# Patient Record
Sex: Female | Born: 2000 | Race: Black or African American | Hispanic: No | Marital: Single | Smoking: Current some day smoker
Health system: Southern US, Community
[De-identification: ages and names within clinical notes are randomized; demographics above are authoritative.]

## PROBLEM LIST (undated history)

## (undated) DIAGNOSIS — E119 Type 2 diabetes mellitus without complications: Secondary | ICD-10-CM

## (undated) HISTORY — DX: Type 2 diabetes mellitus without complications: E11.9

---

## 2020-11-19 ENCOUNTER — Emergency Department (HOSPITAL_COMMUNITY)
Admission: EM | Admit: 2020-11-19 | Discharge: 2020-11-20 | Disposition: A | Discharge status: Home / Self Care | Payer: Medicaid Other | Attending: Emergency Medicine | Admitting: Emergency Medicine

## 2020-11-19 ENCOUNTER — Other Ambulatory Visit: Payer: Self-pay

## 2020-11-19 ENCOUNTER — Encounter (HOSPITAL_COMMUNITY): Payer: Self-pay | Admitting: Emergency Medicine

## 2020-11-19 DIAGNOSIS — F419 Anxiety disorder, unspecified: Secondary | ICD-10-CM | POA: Diagnosis not present

## 2020-11-19 DIAGNOSIS — R002 Palpitations: Secondary | ICD-10-CM | POA: Diagnosis not present

## 2020-11-19 DIAGNOSIS — R079 Chest pain, unspecified: Secondary | ICD-10-CM

## 2020-11-19 DIAGNOSIS — R0602 Shortness of breath: Secondary | ICD-10-CM | POA: Diagnosis not present

## 2020-11-19 DIAGNOSIS — R0789 Other chest pain: Secondary | ICD-10-CM | POA: Diagnosis not present

## 2020-11-19 DIAGNOSIS — N9489 Other specified conditions associated with female genital organs and menstrual cycle: Secondary | ICD-10-CM | POA: Diagnosis not present

## 2020-11-19 NOTE — ED Triage Notes (Signed)
Complains of chest tightness, shortness of breath, palpitations and anxiety x1 week, has had recent stress in her life, also states she thinks when she wakes up she is having panic attacks.

## 2020-11-19 NOTE — ED Provider Notes (Signed)
Emergency Medicine Provider Triage Evaluation Note  Shannon Santos , a 20 y.o. female  was evaluated in triage.  Pt complains of episodes of left-sided arm discomfort, chest tightness, shortness of breath, and palpitations for 1 week.  Patient notes increased anxiety in the past week as she recently lost her brother and has had other stressors.  She states that she is having what she believes to be panic attacks multiple times per day.  In triage she is not having any symptoms.  Review of Systems  Positive: Anxiety, panic attacks, chest tightness, shortness of breath Negative: Fevers, chills, SI, HI, audio or visual hallucinations  Physical Exam  BP (!) 146/80   Pulse 83   Temp 98.4 F (36.9 C) (Oral)   Resp 18   LMP 11/10/2020   SpO2 98%  Gen:   Awake, no distress   Resp:  Normal effort  MSK:   Moves extremities without difficulty  Other:  Lung sounds clear to auscultation all fields  Medical Decision Making  Medically screening exam initiated at 7:12 PM.  Appropriate orders placed.  Genae Strine was informed that the remainder of the evaluation will be completed by another provider, this initial triage assessment does not replace that evaluation, and the importance of remaining in the ED until their evaluation is complete.    Jeanella Flattery 11/19/20 1914    Dione Booze, MD 11/20/20 [phone]

## 2020-11-20 ENCOUNTER — Emergency Department (HOSPITAL_COMMUNITY): Payer: Medicaid Other

## 2020-11-20 LAB — CBC WITH DIFFERENTIAL/PLATELET
Abs Immature Granulocytes: 0.03 10*3/uL (ref 0.00–0.07)
Basophils Absolute: 0 10*3/uL (ref 0.0–0.1)
Basophils Relative: 0 %
Eosinophils Absolute: 0.1 10*3/uL (ref 0.0–0.5)
Eosinophils Relative: 1 %
HCT: 42.8 % (ref 36.0–46.0)
Hemoglobin: 14 g/dL (ref 12.0–15.0)
Immature Granulocytes: 0 %
Lymphocytes Relative: 39 %
Lymphs Abs: 2.6 10*3/uL (ref 0.7–4.0)
MCH: 28.9 pg (ref 26.0–34.0)
MCHC: 32.7 g/dL (ref 30.0–36.0)
MCV: 88.4 fL (ref 80.0–100.0)
Monocytes Absolute: 0.5 10*3/uL (ref 0.1–1.0)
Monocytes Relative: 7 %
Neutro Abs: 3.5 10*3/uL (ref 1.7–7.7)
Neutrophils Relative %: 53 %
Platelets: 296 10*3/uL (ref 150–400)
RBC: 4.84 MIL/uL (ref 3.87–5.11)
RDW: 13.2 % (ref 11.5–15.5)
WBC: 6.7 10*3/uL (ref 4.0–10.5)
nRBC: 0 % (ref 0.0–0.2)

## 2020-11-20 LAB — I-STAT BETA HCG BLOOD, ED (MC, WL, AP ONLY): I-stat hCG, quantitative: <5 m[IU]/mL (ref <5)

## 2020-11-20 LAB — BASIC METABOLIC PANEL
Anion gap: 8 (ref 5–15)
BUN: 12 mg/dL (ref 6–20)
CO2: 26 mmol/L (ref 22–32)
Calcium: 9.5 mg/dL (ref 8.9–10.3)
Chloride: 102 mmol/L (ref 98–111)
Creatinine, Ser: 0.65 mg/dL (ref 0.44–1.00)
GFR, Estimated: >60 mL/min (ref >60)
Glucose, Bld: 97 mg/dL (ref 70–99)
Potassium: 3.7 mmol/L (ref 3.5–5.1)
Sodium: 136 mmol/L (ref 135–145)

## 2020-11-20 LAB — TROPONIN I (HIGH SENSITIVITY): Troponin I (High Sensitivity): 2 ng/L (ref <18)

## 2020-11-20 NOTE — Discharge Instructions (Signed)
Your evaluation today did not show any sign of any serious heart problem.  However, I would still like you to follow-up with a cardiologist to set up a heart monitor to make sure you are not having any problem with your heart rhythm.  You have just gone through a very traumatic time.  I think you would benefit from talking with a counselor.  Please use the resource guide to help you find a counselor to work through the problems that you are having.

## 2020-11-20 NOTE — ED Provider Notes (Signed)
[facility]-EMERGENCY DEPT Provider Note   CSN: [id number] Arrival date & time: 11/19/20  1827     History Chief Complaint  Patient presents with   Anxiety   Shortness of Breath   Chest Pain    Shannon Santos is a 20 y.o. female.  The history is provided by the patient.  Anxiety Associated symptoms include chest pain and shortness of breath.  Shortness of Breath Associated symptoms: chest pain   Chest Pain Associated symptoms: anxiety and shortness of breath   She has no significant medical history and comes in complaining of chest pain, shortness of breath, heart racing over the last week.  2 weeks ago, her brother died by suicide.  About 1 week ago, she started having tight feeling across her chest with sense that her heart was racing.  She could feel her heartbeat racing in her arms.  She states that she checked her pulse at home and it was going "real fast".  She denies nausea, vomiting, diaphoresis.  Nothing makes her symptoms better, nothing makes them worse.  She sometimes wakes up feeling like her heart is racing.  Symptoms are present constantly, but she states that currently her heart is not racing.  She is a non-smoker and denies history of diabetes, hypertension, hyperlipidemia.  There is no family history of premature coronary atherosclerosis.   History reviewed. No pertinent past medical history.  There are no problems to display for this patient.   History reviewed. No pertinent surgical history.   OB History   No obstetric history on file.     No family history on file.     Home Medications Prior to Admission medications   Not on File    Allergies    Patient has no known allergies.  Review of Systems   Review of Systems  Respiratory:  Positive for shortness of breath.   Cardiovascular:  Positive for chest pain.  All other systems reviewed and are negative.  Physical Exam Updated Vital Signs BP (!) 146/80   Pulse 83    Temp 98.4 F (36.9 C) (Oral)   Resp 18   LMP 11/10/2020   SpO2 98%   Physical Exam Vitals and nursing note reviewed.  20 year old female, resting comfortably and in no acute distress. Vital signs are significant for borderline elevated blood pressure. Oxygen saturation is 98%, which is normal. Head is normocephalic and atraumatic. PERRLA, EOMI. Oropharynx is clear. Neck is nontender and supple without adenopathy or JVD. Back is nontender and there is no CVA tenderness. Lungs are clear without rales, wheezes, or rhonchi. Chest is nontender. Heart has regular rate and rhythm without murmur. Abdomen is soft, flat, nontender. Extremities have no cyanosis or edema, full range of motion is present. Skin is warm and dry without rash. Neurologic: Mental status is normal, cranial nerves are intact, moves all extremities equally.  ED Results / Procedures / Treatments   Labs (all labs ordered are listed, but only abnormal results are displayed) Labs Reviewed  CBC WITH DIFFERENTIAL/PLATELET  BASIC METABOLIC PANEL  I-STAT BETA HCG BLOOD, ED ([facility], [facility], [facility] ONLY)  TROPONIN I (HIGH SENSITIVITY)    EKG EKG Interpretation  Date/Time:  Sunday November 20 2020 05:44:03 EDT Ventricular Rate:  76 PR Interval:  174 QRS Duration: 85 QT Interval:  400 QTC Calculation: 450 R Axis:   40 Text Interpretation: Sinus rhythm ST elev, probable normal early repol pattern No old tracing to compare Confirmed by Dione Booze ([id number]) on  11/20/2020 5:47:48 AM  Radiology DG Chest 2 View  Result Date: 11/20/2020 CLINICAL DATA:  Chest pain and chest tightness. Shortness of breath with palpitations and anxiety. EXAM: CHEST - 2 VIEW COMPARISON:  None. FINDINGS: The heart size and mediastinal contours are within normal limits. Both lungs are clear. The visualized skeletal structures are unremarkable. IMPRESSION: No active cardiopulmonary disease. Electronically Signed   By: Signa Kell M.D.   On: 11/20/2020 06:44     Procedures Procedures   Medications Ordered in ED Medications - No data to display  ED Course  I have reviewed the triage vital signs and the nursing notes.  Pertinent labs & imaging results that were available during my care of the patient were reviewed by me and considered in my medical decision making (see chart for details).   MDM Rules/Calculators/A&P                         Chest discomfort which is very unlikely to be cardiac.  Subjective palpitations.  Temporal relation of symptoms to traumatic life event is strongly suggestive of stress reaction.  ECG is significant only for changes of early repolarization.  Patient risk score per heart pathway is 1, which puts her at very low risk for major adverse cardiac events.  Will check ECG, chest x-ray, screening labs.  Anticipate referral for outpatient counseling and also referral to cardiology for consideration of event monitor to make sure she is not having any actual arrhythmias.  Old records were reviewed, and she has no relevant past visits.  Chest x-ray is normal.  Labs are normal including normal troponin.  Patient is advised of these findings.  She is referred to cardiology for consideration for outpatient event monitoring, also given resource guide for outpatient counseling resources.  Return precautions discussed.  Final Clinical Impression(s) / ED Diagnoses Final diagnoses:  Chest pain with low risk for cardiac etiology  Palpitations  Anxiety    Rx / DC Orders ED Discharge Orders     None        Dione Booze, MD 11/20/20 [phone]

## 2020-11-28 DIAGNOSIS — F410 Panic disorder [episodic paroxysmal anxiety] without agoraphobia: Secondary | ICD-10-CM | POA: Diagnosis not present

## 2020-11-28 DIAGNOSIS — F331 Major depressive disorder, recurrent, moderate: Secondary | ICD-10-CM | POA: Diagnosis not present

## 2020-12-08 DIAGNOSIS — F331 Major depressive disorder, recurrent, moderate: Secondary | ICD-10-CM | POA: Diagnosis not present

## 2020-12-08 DIAGNOSIS — F410 Panic disorder [episodic paroxysmal anxiety] without agoraphobia: Secondary | ICD-10-CM | POA: Diagnosis not present

## 2020-12-16 DIAGNOSIS — F410 Panic disorder [episodic paroxysmal anxiety] without agoraphobia: Secondary | ICD-10-CM | POA: Diagnosis not present

## 2020-12-16 DIAGNOSIS — F331 Major depressive disorder, recurrent, moderate: Secondary | ICD-10-CM | POA: Diagnosis not present

## 2020-12-19 DIAGNOSIS — F331 Major depressive disorder, recurrent, moderate: Secondary | ICD-10-CM | POA: Diagnosis not present

## 2020-12-19 DIAGNOSIS — F410 Panic disorder [episodic paroxysmal anxiety] without agoraphobia: Secondary | ICD-10-CM | POA: Diagnosis not present

## 2020-12-27 DIAGNOSIS — F331 Major depressive disorder, recurrent, moderate: Secondary | ICD-10-CM | POA: Diagnosis not present

## 2020-12-27 DIAGNOSIS — F410 Panic disorder [episodic paroxysmal anxiety] without agoraphobia: Secondary | ICD-10-CM | POA: Diagnosis not present

## 2021-01-04 DIAGNOSIS — F410 Panic disorder [episodic paroxysmal anxiety] without agoraphobia: Secondary | ICD-10-CM | POA: Diagnosis not present

## 2021-01-04 DIAGNOSIS — F331 Major depressive disorder, recurrent, moderate: Secondary | ICD-10-CM | POA: Diagnosis not present

## 2021-01-11 DIAGNOSIS — F331 Major depressive disorder, recurrent, moderate: Secondary | ICD-10-CM | POA: Diagnosis not present

## 2021-01-11 DIAGNOSIS — F410 Panic disorder [episodic paroxysmal anxiety] without agoraphobia: Secondary | ICD-10-CM | POA: Diagnosis not present

## 2021-01-20 DIAGNOSIS — F331 Major depressive disorder, recurrent, moderate: Secondary | ICD-10-CM | POA: Diagnosis not present

## 2021-01-20 DIAGNOSIS — F410 Panic disorder [episodic paroxysmal anxiety] without agoraphobia: Secondary | ICD-10-CM | POA: Diagnosis not present

## 2021-02-09 ENCOUNTER — Ambulatory Visit (INDEPENDENT_AMBULATORY_CARE_PROVIDER_SITE_OTHER): Payer: Medicaid Other

## 2021-02-09 ENCOUNTER — Other Ambulatory Visit: Payer: Self-pay

## 2021-02-09 ENCOUNTER — Encounter: Payer: Self-pay | Admitting: Emergency Medicine

## 2021-02-09 ENCOUNTER — Ambulatory Visit
Admission: EM | Admit: 2021-02-09 | Discharge: 2021-02-09 | Disposition: A | Discharge status: Home / Self Care | Payer: Medicaid Other

## 2021-02-09 DIAGNOSIS — R0989 Other specified symptoms and signs involving the circulatory and respiratory systems: Secondary | ICD-10-CM

## 2021-02-09 DIAGNOSIS — J392 Other diseases of pharynx: Secondary | ICD-10-CM

## 2021-02-09 MED ORDER — PREDNISONE 20 MG PO TABS: 40.0000 mg | ORAL_TABLET | Freq: Every day | ORAL | 0 refills | Status: AC

## 2021-02-09 NOTE — ED Triage Notes (Signed)
Patient states that she feels like something is stuck in her throat.  Not for sure if she ate something but she can't get it to go away.  Requesting an xray.

## 2021-02-09 NOTE — ED Provider Notes (Signed)
[facility]    CSN: [id number] Arrival date & time: 02/09/21  1151      History   Chief Complaint Chief Complaint  Patient presents with   Possible Foreign Body in Throat    HPI Joselynn Amoroso is a 21 y.o. female.   Patient here today for evaluation of the sensation of feeling as if something is stuck in her throat. She reports that she started feeling this way yesterday after eating cake at work. She has not had any shortness of breath. She requests xray.   The history is provided by the patient.   History reviewed. No pertinent past medical history.  There are no problems to display for this patient.   History reviewed. No pertinent surgical history.  OB History   No obstetric history on file.      Home Medications    Prior to Admission medications   Medication Sig Start Date End Date Taking? Authorizing Provider  predniSONE (DELTASONE) 20 MG tablet Take 2 tablets (40 mg total) by mouth daily with breakfast for 5 days. 02/09/21 02/14/21 Yes Tomi Bamberger, PA-C  valACYclovir (VALTREX) 1000 MG tablet Take 1,000 mg by mouth daily. 01/30/21  Yes [provider]    Family History History reviewed. No pertinent family history.  Social History Social History   Tobacco Use   Smoking status: Never   Smokeless tobacco: Never  Substance Use Topics   Alcohol use: Never   Drug use: Never     Allergies   Patient has no known allergies.   Review of Systems Review of Systems  Constitutional:  Negative for chills and fever.  HENT:  Negative for sore throat and trouble swallowing.   Eyes:  Negative for discharge and redness.  Respiratory:  Negative for shortness of breath.   Gastrointestinal:  Negative for nausea and vomiting.    Physical Exam Triage Vital Signs ED Triage Vitals [02/09/21 1233]  Enc Vitals Group     BP 129/81     Pulse Rate 90     Resp      Temp 98.4 F (36.9 C)     Temp Source Oral     SpO2 97 %     Weight 232 lb  (105.2 kg)     Height 5\' 4"  (1.626 m)     Head Circumference      Peak Flow      Pain Score 4     Pain Loc      Pain Edu?      Excl. in GC?    No data found.  Updated Vital Signs BP 129/81 (BP Location: Right Arm)    Pulse 90    Temp 98.4 F (36.9 C) (Oral)    Ht 5\' 4"  (1.626 m)    Wt 232 lb (105.2 kg)    LMP 10/29/2020 Comment: Patient states her cycles are abnormal   SpO2 97%    BMI 39.82 kg/m     Physical Exam Vitals and nursing note reviewed.  Constitutional:      General: She is not in acute distress.    Appearance: Normal appearance. She is not ill-appearing.  HENT:     Head: Normocephalic and atraumatic.     Nose: Nose normal. No congestion or rhinorrhea.     Mouth/Throat:     Mouth: Mucous membranes are moist.     Pharynx: No oropharyngeal exudate or posterior oropharyngeal erythema.  Eyes:     Conjunctiva/sclera: Conjunctivae normal.  Cardiovascular:  Rate and Rhythm: Normal rate.  Pulmonary:     Effort: Pulmonary effort is normal. No respiratory distress.  Skin:    General: Skin is warm and dry.  Neurological:     Mental Status: She is alert.  Psychiatric:        Mood and Affect: Mood normal.        Thought Content: Thought content normal.     UC Treatments / Results  Labs (all labs ordered are listed, but only abnormal results are displayed) Labs Reviewed - No data to display  EKG   Radiology DG Neck Soft Tissue  Result Date: 02/09/2021 CLINICAL DATA:  Provided history: Possible foreign body in throat. Additional history provided: Patient feels as though something is stuck in throat. EXAM: NECK SOFT TISSUES - 1+ VIEW COMPARISON:  Chest radiographs 11/20/2020. FINDINGS: There is no evidence of retropharyngeal soft tissue swelling or epiglottic enlargement. The cervical airway is unremarkable and no radio-opaque foreign body identified. IMPRESSION: Unremarkable examination.  No radiopaque foreign body identified. Electronically Signed   By: Jackey Loge D.O.   On: 02/09/2021 13:18    Procedures Procedures (including critical care time)  Medications Ordered in UC Medications - No data to display  Initial Impression / Assessment and Plan / UC Course  I have reviewed the triage vital signs and the nursing notes.  Pertinent labs & imaging results that were available during my care of the patient were reviewed by me and considered in my medical decision making (see chart for details).    Xray normal-- will trial steroid to hopefully decreased any inflammation. Encouraged follow up with any concerns, and advised she report to ED with any worsening symptoms.  Patient expresses understanding.   Final Clinical Impressions(s) / UC Diagnoses   Final diagnoses:  Throat irritation   Discharge Instructions   None    ED Prescriptions     Medication Sig Dispense Auth. Provider   predniSONE (DELTASONE) 20 MG tablet Take 2 tablets (40 mg total) by mouth daily with breakfast for 5 days. 10 tablet Tomi Bamberger, PA-C      PDMP not reviewed this encounter.   Tomi Bamberger, PA-C 02/09/21 1342

## 2021-02-10 ENCOUNTER — Ambulatory Visit (HOSPITAL_COMMUNITY): Payer: Self-pay

## 2021-02-16 ENCOUNTER — Emergency Department (HOSPITAL_COMMUNITY)
Admission: EM | Admit: 2021-02-16 | Discharge: 2021-02-16 | Disposition: A | Discharge status: Home / Self Care | Payer: Medicaid Other | Attending: Emergency Medicine | Admitting: Emergency Medicine

## 2021-02-16 ENCOUNTER — Telehealth: Payer: Self-pay | Admitting: *Deleted

## 2021-02-16 DIAGNOSIS — K219 Gastro-esophageal reflux disease without esophagitis: Secondary | ICD-10-CM | POA: Insufficient documentation

## 2021-02-16 DIAGNOSIS — R682 Dry mouth, unspecified: Secondary | ICD-10-CM | POA: Insufficient documentation

## 2021-02-16 DIAGNOSIS — R109 Unspecified abdominal pain: Secondary | ICD-10-CM | POA: Diagnosis present

## 2021-02-16 LAB — CBC WITH DIFFERENTIAL/PLATELET
Abs Immature Granulocytes: 0.03 10*3/uL (ref 0.00–0.07)
Basophils Absolute: 0 10*3/uL (ref 0.0–0.1)
Basophils Relative: 0 %
Eosinophils Absolute: 0.1 10*3/uL (ref 0.0–0.5)
Eosinophils Relative: 2 %
HCT: 42.6 % (ref 36.0–46.0)
Hemoglobin: 13.8 g/dL (ref 12.0–15.0)
Immature Granulocytes: 0 %
Lymphocytes Relative: 29 %
Lymphs Abs: 2.1 10*3/uL (ref 0.7–4.0)
MCH: 28.5 pg (ref 26.0–34.0)
MCHC: 32.4 g/dL (ref 30.0–36.0)
MCV: 88 fL (ref 80.0–100.0)
Monocytes Absolute: 0.6 10*3/uL (ref 0.1–1.0)
Monocytes Relative: 8 %
Neutro Abs: 4.5 10*3/uL (ref 1.7–7.7)
Neutrophils Relative %: 61 %
Platelets: 356 10*3/uL (ref 150–400)
RBC: 4.84 MIL/uL (ref 3.87–5.11)
RDW: 13.2 % (ref 11.5–15.5)
WBC: 7.4 10*3/uL (ref 4.0–10.5)
nRBC: 0 % (ref 0.0–0.2)

## 2021-02-16 LAB — I-STAT BETA HCG BLOOD, ED (MC, WL, AP ONLY): I-stat hCG, quantitative: <5 m[IU]/mL (ref <5)

## 2021-02-16 LAB — COMPREHENSIVE METABOLIC PANEL
ALT: 16 U/L (ref 0–44)
AST: 13 U/L — ABNORMAL LOW (ref 15–41)
Albumin: 4 g/dL (ref 3.5–5.0)
Alkaline Phosphatase: 72 U/L (ref 38–126)
Anion gap: 7 (ref 5–15)
BUN: 12 mg/dL (ref 6–20)
CO2: 30 mmol/L (ref 22–32)
Calcium: 9.6 mg/dL (ref 8.9–10.3)
Chloride: 102 mmol/L (ref 98–111)
Creatinine, Ser: 0.83 mg/dL (ref 0.44–1.00)
GFR, Estimated: >60 mL/min (ref >60)
Glucose, Bld: 99 mg/dL (ref 70–99)
Potassium: 4 mmol/L (ref 3.5–5.1)
Sodium: 139 mmol/L (ref 135–145)
Total Bilirubin: 0.5 mg/dL (ref 0.3–1.2)
Total Protein: 7.1 g/dL (ref 6.5–8.1)

## 2021-02-16 MED ORDER — OMEPRAZOLE 20 MG PO CPDR: 20.0000 mg | DELAYED_RELEASE_CAPSULE | Freq: Every day | ORAL | 0 refills | Status: DC

## 2021-02-16 NOTE — ED Provider Triage Note (Signed)
Emergency Medicine Provider Triage Evaluation Note  Shannon Santos , a 21 y.o. female  was evaluated in triage.  Pt complains of reflux, generalized stomach pain, sore throat. Onset 1 week ago with sore throat, then developed stomach ache and reflux. Went to UC, given mouth wash and steroids.  Review of Systems  Positive: Sore throat, spots on tongue, reflux Negative: Vomiting, changes in bowel or bladder habits  Physical Exam  BP 125/79 (BP Location: Right Arm)    Pulse 91    Temp 98.7 F (37.1 C) (Oral)    Resp 14    LMP 01/24/2021    SpO2 98%  Gen:   Awake, no distress   Resp:  Normal effort  MSK:   Moves extremities without difficulty  Other:  Mouth without significant findings, reports generalized abdominal tenderness  Medical Decision Making  Medically screening exam initiated at 9:16 AM.  Appropriate orders placed.  Shannon Santos was informed that the remainder of the evaluation will be completed by another provider, this initial triage assessment does not replace that evaluation, and the importance of remaining in the ED until their evaluation is complete.     Shannon Fend, PA-C 02/16/21 [phone]

## 2021-02-16 NOTE — ED Provider Notes (Signed)
[facility] EMERGENCY DEPARTMENT Provider Note   CSN: [id number] Arrival date & time: 02/16/21  0907     History  Chief Complaint  Patient presents with   Gastroesophageal Reflux   Shannon Santos is a well-appearing 21 y.o. female presenting today with heartburn and abdominal pain has increased over the last 1-2 weeks when she had a 2-3 day course of diarrhea nausea and vomiting.  She was seen in urgent care on 02/09/2021 and was provided steroids, which she said made it worse.  Instigators included lying down, sleeping, or random bouts of eating.  This is accompanied with dry mouth mild abdominal pain.  She also complains of "white spots "in the back of her throat.  She has never tried an antacid or PPI.  She denies dysphagia, recent vomiting, recent illness, change in bowel habits, fever, sore throat, neck pain, or urinary symptoms.    The history is provided by the patient and medical records.  Gastroesophageal Reflux Associated symptoms include abdominal pain. Pertinent negatives include no chest pain and no shortness of breath.    Home Medications Prior to Admission medications   Medication Sig Start Date End Date Taking? Authorizing Provider  omeprazole (PRILOSEC) 20 MG capsule Take 1 capsule (20 mg total) by mouth daily. 02/16/21  Yes Prince Rome, PA-C  valACYclovir (VALTREX) 1000 MG tablet Take 1,000 mg by mouth daily. 01/30/21   [provider]      Allergies    Patient has no known allergies.    Review of Systems   Review of Systems  Constitutional:  Negative for chills, diaphoresis and fever.  HENT:  Negative for ear pain, rhinorrhea and sore throat.   Eyes:  Negative for pain and visual disturbance.  Respiratory:  Negative for cough and shortness of breath.   Cardiovascular:  Negative for chest pain and palpitations.  Gastrointestinal:  Positive for abdominal pain. Negative for vomiting.  Genitourinary:  Negative for dysuria.   Musculoskeletal:  Negative for arthralgias and back pain.  Skin:  Negative for color change and rash.  Neurological:  Negative for seizures and syncope.  All other systems reviewed and are negative.  Physical Exam Updated Vital Signs BP 125/79 (BP Location: Right Arm)    Pulse 91    Temp 98.7 F (37.1 C) (Oral)    Resp 14    LMP 01/24/2021    SpO2 98%  Physical Exam Vitals and nursing note reviewed.  Constitutional:      General: She is not in acute distress.    Appearance: Normal appearance. She is well-developed.  HENT:     Head: Normocephalic and atraumatic.     Mouth/Throat:     Mouth: Mucous membranes are moist.     Pharynx: Oropharynx is clear. No oropharyngeal exudate or posterior oropharyngeal erythema.   Eyes:     Conjunctiva/sclera: Conjunctivae normal.  Cardiovascular:     Rate and Rhythm: Normal rate and regular rhythm.     Heart sounds: No murmur heard. Pulmonary:     Effort: Pulmonary effort is normal. No respiratory distress.     Breath sounds: Normal breath sounds.  Abdominal:     Palpations: Abdomen is soft.     Tenderness: There is abdominal tenderness in the epigastric area.  Musculoskeletal:        General: No swelling.     Cervical back: Neck supple.  Skin:    General: Skin is warm and dry.     Capillary Refill: Capillary refill takes  less than 2 seconds.  Neurological:     Mental Status: She is alert.  Psychiatric:        Mood and Affect: Mood normal.    ED Results / Procedures / Treatments   Labs (all labs ordered are listed, but only abnormal results are displayed) Labs Reviewed  COMPREHENSIVE METABOLIC PANEL - Abnormal; Notable for the following components:      Result Value   AST 13 (*)    All other components within normal limits  CBC WITH DIFFERENTIAL/PLATELET  I-STAT BETA HCG BLOOD, ED ([facility], [facility], [facility] ONLY)    EKG None  Radiology No results found.  Procedures Procedures    Medications Ordered in ED Medications - No data to  display  ED Course/ Medical Decision Making/ A&P                           Medical Decision Making  Well-appearing 21 year old female presents to the ED for the concern of gastroesophageal reflux and abdominal pain.  DDx includes GERD, pectoral muscle strain, appendicitis, bowel perforation, and pregnancy.  Additional history obtained from urgent care records.  I ordered and personally interpreted labs.  Patient's beta-hCG is negative so I doubt pregnancy.  CBC and CMP show electrolytes are normal and patient is not anemic.  Unlikely that she has an acute bleed or perforation based on labs, presentation, and physical exam.  Considered CT of abdomen, but do not believe this is necessary at this time based on patient history, soft abdomen with minimal abdominal pain upon palpation, long-term duration of abdominal discomfort.  Patient laughing and smiling.  Appears stable and not in any acute distress.  Patient denies recent activity or motion associated symptoms.  Patient's discomfort is worse at night and while lying supine.  Symptoms, patient history, and lack of relief from steroids strongly suggestive of GERD.    After consideration of the diagnostic results of the patient's response, I feel the patient would benefit from omeprazole until she can follow-up with her primary care.  Also recommended she have her primary care evaluate the white spot noted in her physical exam.  Discussed course of treatment with patient thoroughly.  Patient in agreement and all questions answered.        Final Clinical Impression(s) / ED Diagnoses Final diagnoses:  Gastroesophageal reflux disease, unspecified whether esophagitis present    Rx / DC Orders ED Discharge Orders          Ordered    omeprazole (PRILOSEC) 20 MG capsule  Daily        02/16/21 1012              Prince Rome, Hershal Coria [id number]    Carmin Muskrat, MD 02/16/21 1624

## 2021-02-16 NOTE — Telephone Encounter (Signed)
Pt called regarding Rx not able to be filled as the prescribing EDP is not listed on Medicaid.  RNCM contacted pharmacy and gave permission to utilize Supervising EDP listed on Rx.

## 2021-02-16 NOTE — Discharge Instructions (Addendum)
Take the omeprazole once a day.  We have provided you with a 30-day supply.  Follow-up with primary care in the next week for medical management and further evaluation.  Return to the ED if you have new or worsening symptoms.

## 2021-02-16 NOTE — ED Triage Notes (Signed)
Pt. Stated Ive had stomach issues with heart burn and My mouth is getting really dry.  I also spots on my tongue.

## 2021-02-23 DIAGNOSIS — F410 Panic disorder [episodic paroxysmal anxiety] without agoraphobia: Secondary | ICD-10-CM | POA: Diagnosis not present

## 2021-02-23 DIAGNOSIS — F331 Major depressive disorder, recurrent, moderate: Secondary | ICD-10-CM | POA: Diagnosis not present

## 2021-03-03 DIAGNOSIS — F331 Major depressive disorder, recurrent, moderate: Secondary | ICD-10-CM | POA: Diagnosis not present

## 2021-03-03 DIAGNOSIS — F410 Panic disorder [episodic paroxysmal anxiety] without agoraphobia: Secondary | ICD-10-CM | POA: Diagnosis not present

## 2021-03-10 DIAGNOSIS — F410 Panic disorder [episodic paroxysmal anxiety] without agoraphobia: Secondary | ICD-10-CM | POA: Diagnosis not present

## 2021-03-10 DIAGNOSIS — F331 Major depressive disorder, recurrent, moderate: Secondary | ICD-10-CM | POA: Diagnosis not present

## 2021-09-12 DIAGNOSIS — R079 Chest pain, unspecified: Secondary | ICD-10-CM | POA: Diagnosis not present

## 2021-09-12 DIAGNOSIS — Z20822 Contact with and (suspected) exposure to covid-19: Secondary | ICD-10-CM | POA: Diagnosis not present

## 2021-09-12 DIAGNOSIS — R0602 Shortness of breath: Secondary | ICD-10-CM | POA: Diagnosis not present

## 2021-09-12 DIAGNOSIS — R0789 Other chest pain: Secondary | ICD-10-CM | POA: Diagnosis not present

## 2021-10-09 DIAGNOSIS — Z6841 Body Mass Index (BMI) 40.0 and over, adult: Secondary | ICD-10-CM | POA: Diagnosis not present

## 2021-10-09 DIAGNOSIS — J111 Influenza due to unidentified influenza virus with other respiratory manifestations: Secondary | ICD-10-CM | POA: Diagnosis not present

## 2021-10-09 DIAGNOSIS — Z20822 Contact with and (suspected) exposure to covid-19: Secondary | ICD-10-CM | POA: Diagnosis not present

## 2021-10-19 ENCOUNTER — Emergency Department
Admission: EM | Admit: 2021-10-19 | Discharge: 2021-10-19 | Disposition: A | Discharge status: Home / Self Care | Payer: Medicaid Other | Attending: Emergency Medicine | Admitting: Emergency Medicine

## 2021-10-19 DIAGNOSIS — T7840XA Allergy, unspecified, initial encounter: Secondary | ICD-10-CM | POA: Insufficient documentation

## 2021-10-19 DIAGNOSIS — R109 Unspecified abdominal pain: Secondary | ICD-10-CM | POA: Diagnosis present

## 2021-10-19 MED ORDER — FAMOTIDINE 20 MG PO TABS
20.0000 mg | ORAL_TABLET | Freq: Once | ORAL | Status: AC
  Administered 2021-10-19: 20 mg via ORAL
  Filled 2021-10-19: qty 1

## 2021-10-19 MED ORDER — PREDNISONE 20 MG PO TABS: 40.0000 mg | ORAL_TABLET | Freq: Every day | ORAL | 0 refills | Status: AC

## 2021-10-19 MED ORDER — PREDNISONE 20 MG PO TABS
60.0000 mg | ORAL_TABLET | ORAL | Status: AC
  Administered 2021-10-19: 60 mg via ORAL
  Filled 2021-10-19: qty 3

## 2021-10-19 NOTE — ED Triage Notes (Signed)
Pt presents to ED via POV with c/o of an allergic reaction. Pt states she was in [employer] when she started getting hives and itching. Pt complaining of itching and redness. Pt took benadryl at 2005. Denies CP, SOB, difficulty breathing

## 2021-10-19 NOTE — Discharge Instructions (Signed)
You have been seen in the Emergency Department (ED) today for an allergic reaction.  You have been stable throughout your stay in the Emergency Department.  Please take your medications as prescribed and follow up with your doctor as indicated.  You should also take over-the-counter cetirizine (Zyrtec) around the clock for the next three days according to the dosing instructions on the package.  Return to the Emergency Department (ED) if you experience any worsening or new symptoms that concern you.

## 2021-10-19 NOTE — ED Provider Notes (Signed)
[facility] Provider Note    Event Date/Time   First MD Initiated Contact with Patient 10/19/21 2306     (approximate)   History   Allergic Reaction   HPI  Shannon Santos is a 21 y.o. female who presents with her boyfriend for evaluation of possible allergic reaction.  They state that she had dinner together and included some Peppercorn chicken which she has not had before.  Immediately after finishing dinner they went to [employer] and while she was at [employer] she developed sudden onset of abdominal cramping and diarrhea.  She also developed a generalized itchy rash all over her body.  She started to panic little bit because of the itching and the rash including general constellation of symptoms.  She took Benadryl 50 mg and then they came to the emergency department.  She did not have any shortness of breath or difficulty swallowing.  He did not notice any swelling to her mouth or her neck or face.  The rash has resolved by the time that I saw her.  The onset of the symptoms was within the last 2 hours.  She had a similar allergic reaction several months ago but she thought maybe it was due to her detergent or some other environmental exposure.  She has not had any issues since changing her detergent.  The exposure tonight was almost immediate after eating, not delayed by 5 or 6 hours, and she ate chicken, not beef or pork.  She is awake, alert, feeling much better, happy, in no distress at this time.     Physical Exam   Triage Vital Signs: ED Triage Vitals  Enc Vitals Group     BP 10/19/21 2225 116/74     Pulse Rate 10/19/21 2225 (!) 108     Resp 10/19/21 2225 18     Temp 10/19/21 2225 98.1 F (36.7 C)     Temp Source 10/19/21 2225 Oral     SpO2 10/19/21 2225 94 %     Weight --      Height 10/19/21 2231 1.626 m (5\' 4" )     Head Circumference --      Peak Flow --      Pain Score 10/19/21 2231 0     Pain Loc --      Pain Edu? --      Excl. in [place]?  --     Most recent vital signs: Vitals:   10/19/21 2225  BP: 116/74  Pulse: (!) 108  Resp: 18  Temp: 98.1 F (36.7 C)  SpO2: 94%     General: Awake, no distress.  CV:  Good peripheral perfusion.  Normal heart sounds.  No tachycardia during my assessment. Resp:  Normal effort.  Lungs are clear to auscultation.  No wheezing. Abd:  No distention.  No tenderness to palpation. Other:  Urticarial rash has resolved.  No angioedema.  Tolerating secretions, lying supine without difficulty.   ED Results / Procedures / Treatments   Labs (all labs ordered are listed, but only abnormal results are displayed) Labs Reviewed - No data to display      PROCEDURES:  Critical Care performed: No  Procedures   MEDICATIONS ORDERED IN ED: Medications  predniSONE (DELTASONE) tablet 60 mg (has no administration in time range)  famotidine (PEPCID) tablet 20 mg (has no administration in time range)     IMPRESSION / MDM / ASSESSMENT AND PLAN / ED COURSE  I reviewed the triage vital  signs and the nursing notes.                              Differential diagnosis includes, but is not limited to, allergic reaction, anaphylaxis, panic attack.   Patient's presentation is most consistent with acute presentation with potential threat to life or bodily function.  Fortunately the patient's symptoms have nearly completely or even completely resolved after taking Benadryl prior to arrival.  She no longer has a rash and is no longer having any abdominal pain, nausea, diarrhea, etc.  No angioedema.  No respiratory involvement.  Although arguably her initial presentation could have represented anaphylaxis, it does not at this time.  She has had no treatment other than Benadryl.  I ordered prednisone 60 mg and famotidine 20 mg, both by mouth.  I recommended observation for the next few hours to see if she has any recurrence of her symptoms, but she and her boyfriend are quite adamant that they are ready  to leave.  Given no clear evidence of anaphylaxis at this time I think that is reasonable.  A single dose of Benadryl 50 mg seems to have completely resolved her symptoms.  I suspect that anxiety or panic over the onset of the symptoms may have contributed to the initial severity of the reaction.  She is getting the medications in the emergency department and I am providing a prescription for prednisone.  I had my usual and customary allergic reaction management talk.  She will fill the prednisone and take cetirizine or other allergy medicine at home.  She understands the return precautions and agrees with the plan.         FINAL CLINICAL IMPRESSION(S) / ED DIAGNOSES   Final diagnoses:  Allergic reaction, initial encounter     Rx / DC Orders   ED Discharge Orders          Ordered    predniSONE (DELTASONE) 20 MG tablet  Daily with breakfast        10/19/21 2333             Note:  This document was prepared using Dragon voice recognition software and may include unintentional dictation errors.   Hinda Kehr, MD 10/19/21 2337

## 2021-12-08 ENCOUNTER — Other Ambulatory Visit: Payer: Self-pay

## 2021-12-08 ENCOUNTER — Emergency Department
Admission: EM | Admit: 2021-12-08 | Discharge: 2021-12-08 | Disposition: A | Discharge status: Home / Self Care | Payer: Medicaid Other | Attending: Emergency Medicine | Admitting: Emergency Medicine

## 2021-12-08 ENCOUNTER — Encounter: Payer: Self-pay | Admitting: Emergency Medicine

## 2021-12-08 DIAGNOSIS — L509 Urticaria, unspecified: Secondary | ICD-10-CM

## 2021-12-08 DIAGNOSIS — T7840XA Allergy, unspecified, initial encounter: Secondary | ICD-10-CM | POA: Insufficient documentation

## 2021-12-08 MED ORDER — CETIRIZINE HCL 10 MG PO TABS: 10.0000 mg | ORAL_TABLET | Freq: Every day | ORAL | 2 refills | Status: DC

## 2021-12-08 MED ORDER — EPINEPHRINE 0.3 MG/0.3ML IJ SOAJ: 0.3000 mg | INTRAMUSCULAR | 0 refills | Status: DC | PRN

## 2021-12-08 NOTE — ED Triage Notes (Signed)
Patient ambulatory to triage with steady gait, without difficulty or distress noted; pt reports generalized itching tonight with no known cause; took 2 benadryl PTA

## 2021-12-08 NOTE — Discharge Instructions (Signed)
Take cetirizine as prescribed.   Thank you for choosing [place] for your health care today!  Please see your primary doctor this week for a follow up appointment.   If you do not have a primary doctor call the following clinics to establish care:  If you have insurance:  [facility] [phone] [address] [place] [place] [address]   [facility]  [phone] [address] [place] [place] [address]   If you do not have insurance:  [facility]  [phone] [address], [place] [address]  Sometimes, in the early stages of certain disease courses it is difficult to detect in the emergency department evaluation -- so, it is important that you continue to monitor your symptoms and call your doctor right away or return to the emergency department if you develop any new or worsening symptoms.  It was my pleasure to care for you today.   Daneil Dan Modesto Charon, MD

## 2021-12-08 NOTE — ED Provider Notes (Signed)
[facility] Provider Note    Event Date/Time   First MD Initiated Contact with Patient 12/08/21 0206     (approximate)   History   Allergic Reaction   HPI  Shannon Santos is a 21 y.o. female   This young woman has no significant past medical history no known allergies who presents to the emergency department with hives itching rash that started around 11 PM tonight which completely resolved by the time I evaluated her after she took Benadryl from home.  She has no known new exposures and has no other systems involvement denies abdominal cramping, nausea, vomiting, oropharyngeal involvement, respiratory distress.  She has had similar instances with hives resolving with Benadryl in the past.  History was obtained via the patient. Independent historian includes her friend who is at bedside who gave collateral information      Physical Exam   Triage Vital Signs: ED Triage Vitals [12/08/21 0009]  Enc Vitals Group     BP 125/84     Pulse Rate 96     Resp 18     Temp 98.2 F (36.8 C)     Temp Source Oral     SpO2 98 %     Weight 240 lb (108.9 kg)     Height 5\' 4"  (1.626 m)     Head Circumference      Peak Flow      Pain Score 0     Pain Loc      Pain Edu?      Excl. in [place]?     Most recent vital signs: Vitals:   12/08/21 0009 12/08/21 0245  BP: 125/84 127/65  Pulse: 96 85  Resp: 18 18  Temp: 98.2 F (36.8 C)   SpO2: 98% 98%    General: Awake, no distress.  CV:  Good peripheral perfusion.  Resp:  Normal effort.  Abd:  No distention.  Other:  Well appearance at this time with no skin rashes on my examination, lungs clear without wheezing, abdomen soft and nontender, phonation is normal and oropharynx appears normal.   ED Results / Procedures / Treatments   Labs (all labs ordered are listed, but only abnormal results are displayed) Labs Reviewed - No data to display   PROCEDURES:  Critical Care performed:  No  Procedures   MEDICATIONS ORDERED IN ED: Medications - No data to display   IMPRESSION / MDM / ASSESSMENT AND PLAN / ED COURSE  I reviewed the triage vital signs and the nursing notes.                              Differential diagnosis includes, but is not limited to, urticaria, allergic reaction, anaphylaxis, infection    MDM: Is a healthy young patient with a skin rash that is itchy that came to the emergency department needs to be evaluated for emergent allergic reaction like anaphylaxis or skin infection.  However, fortunately she does look well and the rash is resolved with Benadryl that she took from home.  There were no to systems involvement so this is not anaphylaxis.  The skin appears normal and noninfected.  She appears well.  Sounds like urticaria.  I advised for her to take nonsedating antihistamines like Zyrtec which I prescribed for her.  I also prescribed for her an EpiPen given multiple instances in her past of allergic reactions, and instructed her on proper use.  She is  asymptomatic at this time, safe for discharge home with the above plan.  She will follow-up with her PMD.   Patient's presentation is most consistent with acute presentation with potential threat to life or bodily function.       FINAL CLINICAL IMPRESSION(S) / ED DIAGNOSES   Final diagnoses:  Urticaria     Rx / DC Orders   ED Discharge Orders          Ordered    EPINEPHrine 0.3 mg/0.3 mL IJ SOAJ injection  As needed        12/08/21 0233    cetirizine (ZYRTEC ALLERGY) 10 MG tablet  Daily        12/08/21 0233             Note:  This document was prepared using Dragon voice recognition software and may include unintentional dictation errors.    Lucillie Garfinkel, MD 12/08/21 [phone]

## 2021-12-08 NOTE — ED Notes (Signed)
Signing pad did not work, pt verbalized understanding of Dc instructions. 

## 2022-02-22 ENCOUNTER — Encounter: Payer: Self-pay | Admitting: Emergency Medicine

## 2022-02-22 ENCOUNTER — Other Ambulatory Visit: Payer: Self-pay

## 2022-02-22 DIAGNOSIS — T7840XA Allergy, unspecified, initial encounter: Secondary | ICD-10-CM | POA: Diagnosis not present

## 2022-02-22 DIAGNOSIS — L509 Urticaria, unspecified: Secondary | ICD-10-CM | POA: Insufficient documentation

## 2022-02-22 DIAGNOSIS — L500 Allergic urticaria: Secondary | ICD-10-CM | POA: Diagnosis not present

## 2022-02-22 NOTE — ED Triage Notes (Signed)
Pt to ED from home c/o allergic reaction tonight around 2240.  States unknown what's causing the reaction but has happened several times in the past.  Has changed out detergents and soaps at home.  States gets hot, hives all over, itching.  Took 2 benadryl at 2240.  No noticeable hives and states itching reduced at this time.  Pt A&Ox4, chest rise even and unlabored, denies tongue or throat involvement, maintaining secretions and in NAD at this time.

## 2022-02-23 ENCOUNTER — Emergency Department
Admission: EM | Admit: 2022-02-23 | Discharge: 2022-02-23 | Disposition: A | Discharge status: Home / Self Care | Payer: Medicaid Other | Attending: Emergency Medicine | Admitting: Emergency Medicine

## 2022-02-23 DIAGNOSIS — L509 Urticaria, unspecified: Secondary | ICD-10-CM

## 2022-02-23 DIAGNOSIS — T7840XA Allergy, unspecified, initial encounter: Secondary | ICD-10-CM

## 2022-02-23 MED ORDER — PREDNISONE 20 MG PO TABS: ORAL_TABLET | ORAL | 0 refills | Status: DC

## 2022-02-23 MED ORDER — FAMOTIDINE 20 MG PO TABS
20.0000 mg | ORAL_TABLET | Freq: Once | ORAL | Status: AC
  Administered 2022-02-23: 20 mg via ORAL
  Filled 2022-02-23: qty 1

## 2022-02-23 MED ORDER — DIPHENHYDRAMINE HCL 25 MG PO CAPS: 50.0000 mg | ORAL_CAPSULE | Freq: Once | ORAL | Status: DC

## 2022-02-23 MED ORDER — PREDNISONE 20 MG PO TABS
60.0000 mg | ORAL_TABLET | Freq: Once | ORAL | Status: AC
  Administered 2022-02-23: 60 mg via ORAL
  Filled 2022-02-23: qty 3

## 2022-02-23 MED ORDER — FAMOTIDINE 20 MG PO TABS: 20.0000 mg | ORAL_TABLET | Freq: Two times a day (BID) | ORAL | 0 refills | Status: DC

## 2022-02-23 MED ORDER — EPINEPHRINE 0.3 MG/0.3ML IJ SOAJ: 0.3000 mg | Freq: Once | INTRAMUSCULAR | 0 refills | Status: AC

## 2022-02-23 NOTE — ED Provider Notes (Signed)
[facility] Provider Note    Event Date/Time   First MD Initiated Contact with Patient 02/23/22 [phone]     (approximate)   History   Allergic Reaction (/)   HPI  Shannon Santos is a 22 y.o. female who presents to the ED from home with a chief complaint of allergic reaction with hives which began around 10:40 PM.  States similar symptoms previously.  Does not know what is causing her reactions.  Has changed her detergents and soaps at home.  Did not eat anything new or take new medications including over-the-counter medications.  Took 2 Benadryl at the time the reaction started and states hives have not resolved.  Denies face or tongue swelling, difficulty swallowing, wheezing/shortness of breath, chest pain, vomiting or diarrhea.     Past Medical History  History reviewed. No pertinent past medical history.   Active Problem List  There are no problems to display for this patient.    Past Surgical History  History reviewed. No pertinent surgical history.   Home Medications   Prior to Admission medications   Medication Sig Start Date End Date Taking? Authorizing Provider  EPINEPHrine 0.3 mg/0.3 mL IJ SOAJ injection Inject 0.3 mg into the muscle once for 1 dose. 02/23/22 02/23/22 Yes Paulette Blanch, MD  famotidine (PEPCID) 20 MG tablet Take 1 tablet (20 mg total) by mouth 2 (two) times daily. 02/23/22  Yes Paulette Blanch, MD  predniSONE (DELTASONE) 20 MG tablet 3 tablets daily x 4 days 02/23/22  Yes Paulette Blanch, MD  cetirizine (ZYRTEC ALLERGY) 10 MG tablet Take 1 tablet (10 mg total) by mouth daily. 12/08/21 12/08/22  Lucillie Garfinkel, MD  omeprazole (PRILOSEC) 20 MG capsule Take 1 capsule (20 mg total) by mouth daily. 8/46/96   Prince Rome, PA-C  valACYclovir (VALTREX) 1000 MG tablet Take 1,000 mg by mouth daily. 01/30/21   [provider]     Allergies  Patient has no known allergies.   Family History  History reviewed. No pertinent family  history.   Physical Exam  Triage Vital Signs: ED Triage Vitals  Enc Vitals Group     BP 02/22/22 2314 126/81     Pulse Rate 02/22/22 2314 92     Resp 02/22/22 2314 18     Temp 02/22/22 2314 98 F (36.7 C)     Temp Source 02/22/22 2314 Oral     SpO2 02/22/22 2314 99 %     Weight 02/22/22 2314 250 lb (113.4 kg)     Height 02/22/22 2314 5\' 4"  (1.626 m)     Head Circumference --      Peak Flow --      Pain Score 02/22/22 2324 0     Pain Loc --      Pain Edu? --      Excl. in [place]? --     Updated Vital Signs: BP 126/81 (BP Location: Left Arm)   Pulse 92   Temp 98 F (36.7 C) (Oral)   Resp 18   Ht 5\' 4"  (1.626 m)   Wt 113.4 kg   LMP 01/22/2022 (Approximate)   SpO2 99%   BMI 42.91 kg/m    General: Awake, no distress.  CV:  RRR.  Good peripheral perfusion.  Resp:  Normal effort.  CTAB. Abd:  No distention.  Other:  No visible urticaria.  There is no tongue or lip angioedema.  Phonation normal.  Posterior oropharynx is clear.  There is no  hoarse or muffled voice.  There is no drooling.  Tolerating secretions well.  No neck swelling.   ED Results / Procedures / Treatments  Labs (all labs ordered are listed, but only abnormal results are displayed) Labs Reviewed - No data to display   EKG  None   RADIOLOGY None   Official radiology report(s): No results found.   PROCEDURES:  Critical Care performed: No  Procedures   MEDICATIONS ORDERED IN ED: Medications  predniSONE (DELTASONE) tablet 60 mg (has no administration in time range)  diphenhydrAMINE (BENADRYL) capsule 50 mg (has no administration in time range)  famotidine (PEPCID) tablet 20 mg (has no administration in time range)     IMPRESSION / MDM / ASSESSMENT AND PLAN / ED COURSE  I reviewed the triage vital signs and the nursing notes.                             22 year old female presenting with allergic reaction, urticaria.  Will treat with Prednisone and Pepcid x 5 days; Benadryl as needed.   Will prescribe EpiPen to use as needed in case of emergency.  Patient will follow-up with ENT for allergy testing.  Strict return precautions given.  Patient and family member verbalized understanding and agree with plan of care.  Patient's presentation is most consistent with acute, uncomplicated illness.   FINAL CLINICAL IMPRESSION(S) / ED DIAGNOSES   Final diagnoses:  Allergic reaction, initial encounter  Urticaria     Rx / DC Orders   ED Discharge Orders          Ordered    predniSONE (DELTASONE) 20 MG tablet        02/23/22 0142    famotidine (PEPCID) 20 MG tablet  2 times daily        02/23/22 0142    EPINEPHrine 0.3 mg/0.3 mL IJ SOAJ injection   Once        02/23/22 0142             Note:  This document was prepared using Dragon voice recognition software and may include unintentional dictation errors.   Paulette Blanch, MD 02/23/22 [phone]

## 2022-02-23 NOTE — Discharge Instructions (Signed)
1. Take the following medicines for the next 4 days: Prednisone 60mg daily Pepcid 20mg twice daily 2. Take Benadryl as needed for itching. 3. Use Epi-Pen in case of acute, life-threatening allergic reaction. 4. Return to the ER for worsening symptoms, persistent vomiting, difficulty breathing or other concerns.  

## 2022-12-02 ENCOUNTER — Emergency Department
Admission: EM | Admit: 2022-12-02 | Discharge: 2022-12-02 | Disposition: A | Discharge status: Home / Self Care | Payer: Medicaid Other | Attending: Emergency Medicine | Admitting: Emergency Medicine

## 2022-12-02 ENCOUNTER — Emergency Department: Payer: Medicaid Other

## 2022-12-02 DIAGNOSIS — W230XXA Caught, crushed, jammed, or pinched between moving objects, initial encounter: Secondary | ICD-10-CM | POA: Diagnosis not present

## 2022-12-02 DIAGNOSIS — S6991XA Unspecified injury of right wrist, hand and finger(s), initial encounter: Secondary | ICD-10-CM

## 2022-12-02 DIAGNOSIS — S63634A Sprain of interphalangeal joint of right ring finger, initial encounter: Secondary | ICD-10-CM | POA: Diagnosis not present

## 2022-12-02 DIAGNOSIS — S63614A Unspecified sprain of right ring finger, initial encounter: Secondary | ICD-10-CM | POA: Diagnosis not present

## 2022-12-02 LAB — POC URINE PREG, ED: Preg Test, Ur: NEGATIVE

## 2022-12-02 MED ORDER — IBUPROFEN 600 MG PO TABS: 600.0000 mg | ORAL_TABLET | Freq: Three times a day (TID) | ORAL | 0 refills | Status: DC | PRN

## 2022-12-02 MED ORDER — IBUPROFEN 600 MG PO TABS
600.0000 mg | ORAL_TABLET | Freq: Once | ORAL | Status: AC
  Administered 2022-12-02: 600 mg via ORAL
  Filled 2022-12-02: qty 1

## 2022-12-02 NOTE — ED Triage Notes (Signed)
Pt arrived POV for right 4th digit swelling with slight bruising noted and right thumb pain. Pt reports thinks she may have "jammed" her ring finger, but cannot remember. Unknown injurt to right thumb as well. CNS intact, NAD Noted. VSS

## 2022-12-02 NOTE — ED Provider Notes (Signed)
[place] [facility] DEPARTMENT AT [facility] Provider Note   CSN: [id number] Arrival date & time: 12/02/22  2027     History  Chief Complaint  Patient presents with   Finger Injury    Shannon Santos is a 22 y.o. female.  Presents to the emergency department valuation of right fourth digit swelling and right thumb discomfort.  Patient states she jammed her thumb and mostly her right ring finger somehow by hitting it on a hard object.  She states the joints felt funny during the episode but denies the joint being dislocated and having to relocate it.  There is no deformity currently.  She has full active flexion and extension of the digits but has swelling of the right ring PIP joint.  No swelling of the thumb.  Sensation is intact distally.  Compartments are soft.  No skin breakdown noted.  She is left-hand dominant.  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 Authorizing Provider  ibuprofen (ADVIL) 600 MG tablet Take 1 tablet (600 mg total) by mouth every 8 (eight) hours as needed for moderate pain (pain score 4-6). Take with food 12/02/22  Yes Evon Slack, PA-C  cetirizine (ZYRTEC ALLERGY) 10 MG tablet Take 1 tablet (10 mg total) by mouth daily. 12/08/21 12/08/22  Pilar Jarvis, MD  famotidine (PEPCID) 20 MG tablet Take 1 tablet (20 mg total) by mouth 2 (two) times daily. 02/23/22   Irean Hong, MD  omeprazole (PRILOSEC) 20 MG capsule Take 1 capsule (20 mg total) by mouth daily. 02/16/21   Cecil Cobbs, PA-C  predniSONE (DELTASONE) 20 MG tablet 3 tablets daily x 4 days 02/23/22   Irean Hong, MD  valACYclovir (VALTREX) 1000 MG tablet Take 1,000 mg by mouth daily. 01/30/21   [provider]      Allergies    Patient has no known allergies.    Review of Systems   Review of Systems  Physical Exam Updated Vital Signs BP 128/83 (BP Location:  Left Arm)   Pulse 95   Temp 98.7 F (37.1 C)   Resp 19   Ht 5\' 4"  (1.626 m)   Wt 117.9 kg   LMP  (LMP Unknown)   SpO2 98%   BMI 44.63 kg/m  Physical Exam Constitutional:      Appearance: She is well-developed.  HENT:     Head: Normocephalic and atraumatic.  Eyes:     Conjunctiva/sclera: Conjunctivae normal.  Cardiovascular:     Rate and Rhythm: Normal rate.  Pulmonary:     Effort: Pulmonary effort is normal. No respiratory distress.  Musculoskeletal:        General: Normal range of motion.     Cervical back: Normal range of motion.     Comments: Right hand with full active extension of the digits.  Swelling noted at the PIP joint of the right fourth digit.  No skin breakdown noted.  No deformity.  She has close to full flexion of all digits of the ring finger which she lacks a couple centimeters of full flexion.  She has some soreness along the thumb but no swelling or deformity.  No ligamentous laxity of the thumb with UCL or RCL testing.  Skin:    General: Skin is warm.     Findings: No rash.  Neurological:     Mental  Status: She is alert and oriented to person, place, and time.  Psychiatric:        Behavior: Behavior normal.        Thought Content: Thought content normal.     ED Results / Procedures / Treatments   Labs (all labs ordered are listed, but only abnormal results are displayed) Labs Reviewed  POC URINE PREG, ED    EKG None  Radiology DG Hand Complete Right  Result Date: 12/02/2022 CLINICAL DATA:  Swelling and bruising to right thumb, jammed EXAM: RIGHT HAND - COMPLETE 3+ VIEW COMPARISON:  None Available. FINDINGS: There is no evidence of fracture or dislocation. There is no evidence of arthropathy or other focal bone abnormality. Soft tissues are unremarkable. IMPRESSION: Negative. Electronically Signed   By: Charlett Nose M.D.   On: 12/02/2022 21:06    Procedures Procedures    Medications Ordered in ED Medications  ibuprofen (ADVIL) tablet 600  mg (has no administration in time range)    ED Course/ Medical Decision Making/ A&P                                 Medical Decision Making Amount and/or Complexity of Data Reviewed Radiology: ordered.  Risk Prescription drug management.   22 year old female who jammed her right hand fourth digit and also had a bit of right thumb pain.  Has typical swelling at the PIP joint with no history of deformity and no current deformity.  X-rays negative for any acute bony abnormality throughout the right hand.  She has no ligamentous laxity on exam of the ring finger or the thumb.  She has good flexion extension.  Buddy tape her third and fourth digits together.  She is started on ibuprofen and will take medications as prescribed.  She understands rest ice and elevation.  She will follow-up with orthopedics in 1 week if no improvement.  She understands signs symptoms return to the ER for. Final Clinical Impression(s) / ED Diagnoses Final diagnoses:  Injury of finger of right hand, initial encounter  Sprain of interphalangeal joint of right ring finger, initial encounter    Rx / DC Orders ED Discharge Orders          Ordered    ibuprofen (ADVIL) 600 MG tablet  Every 8 hours PRN        12/02/22 2121              Ronnette Juniper 12/02/22 2126    Chesley Noon, MD 12/03/22 [phone]

## 2022-12-02 NOTE — Discharge Instructions (Signed)
Please buddy tape 3rd-4th finger daily for 2 weeks.  Work on gentle digit range of motion.  Take ibuprofen as prescribed and apply ice 20 minutes every hour for the next couple of days.  Return to the ER for any worsening symptoms or any urgent changes in your health.  You may follow-up with orthopedics in 1 week if no improvement

## 2022-12-02 NOTE — ED Notes (Signed)
 Patient discharged from ED by provider. Discharge instructions reviewed with patient and all questions answered. Patient ambulatory from ED in NAD.

## 2023-08-10 IMAGING — CR DG CHEST 2V
2 series · 2 of 2 positions shown · non-contrast
Comparison: None.

CLINICAL DATA: Chest pain and chest tightness. Shortness of breath
with palpitations and anxiety.

EXAM:
CHEST - 2 VIEW

[w chest pa]
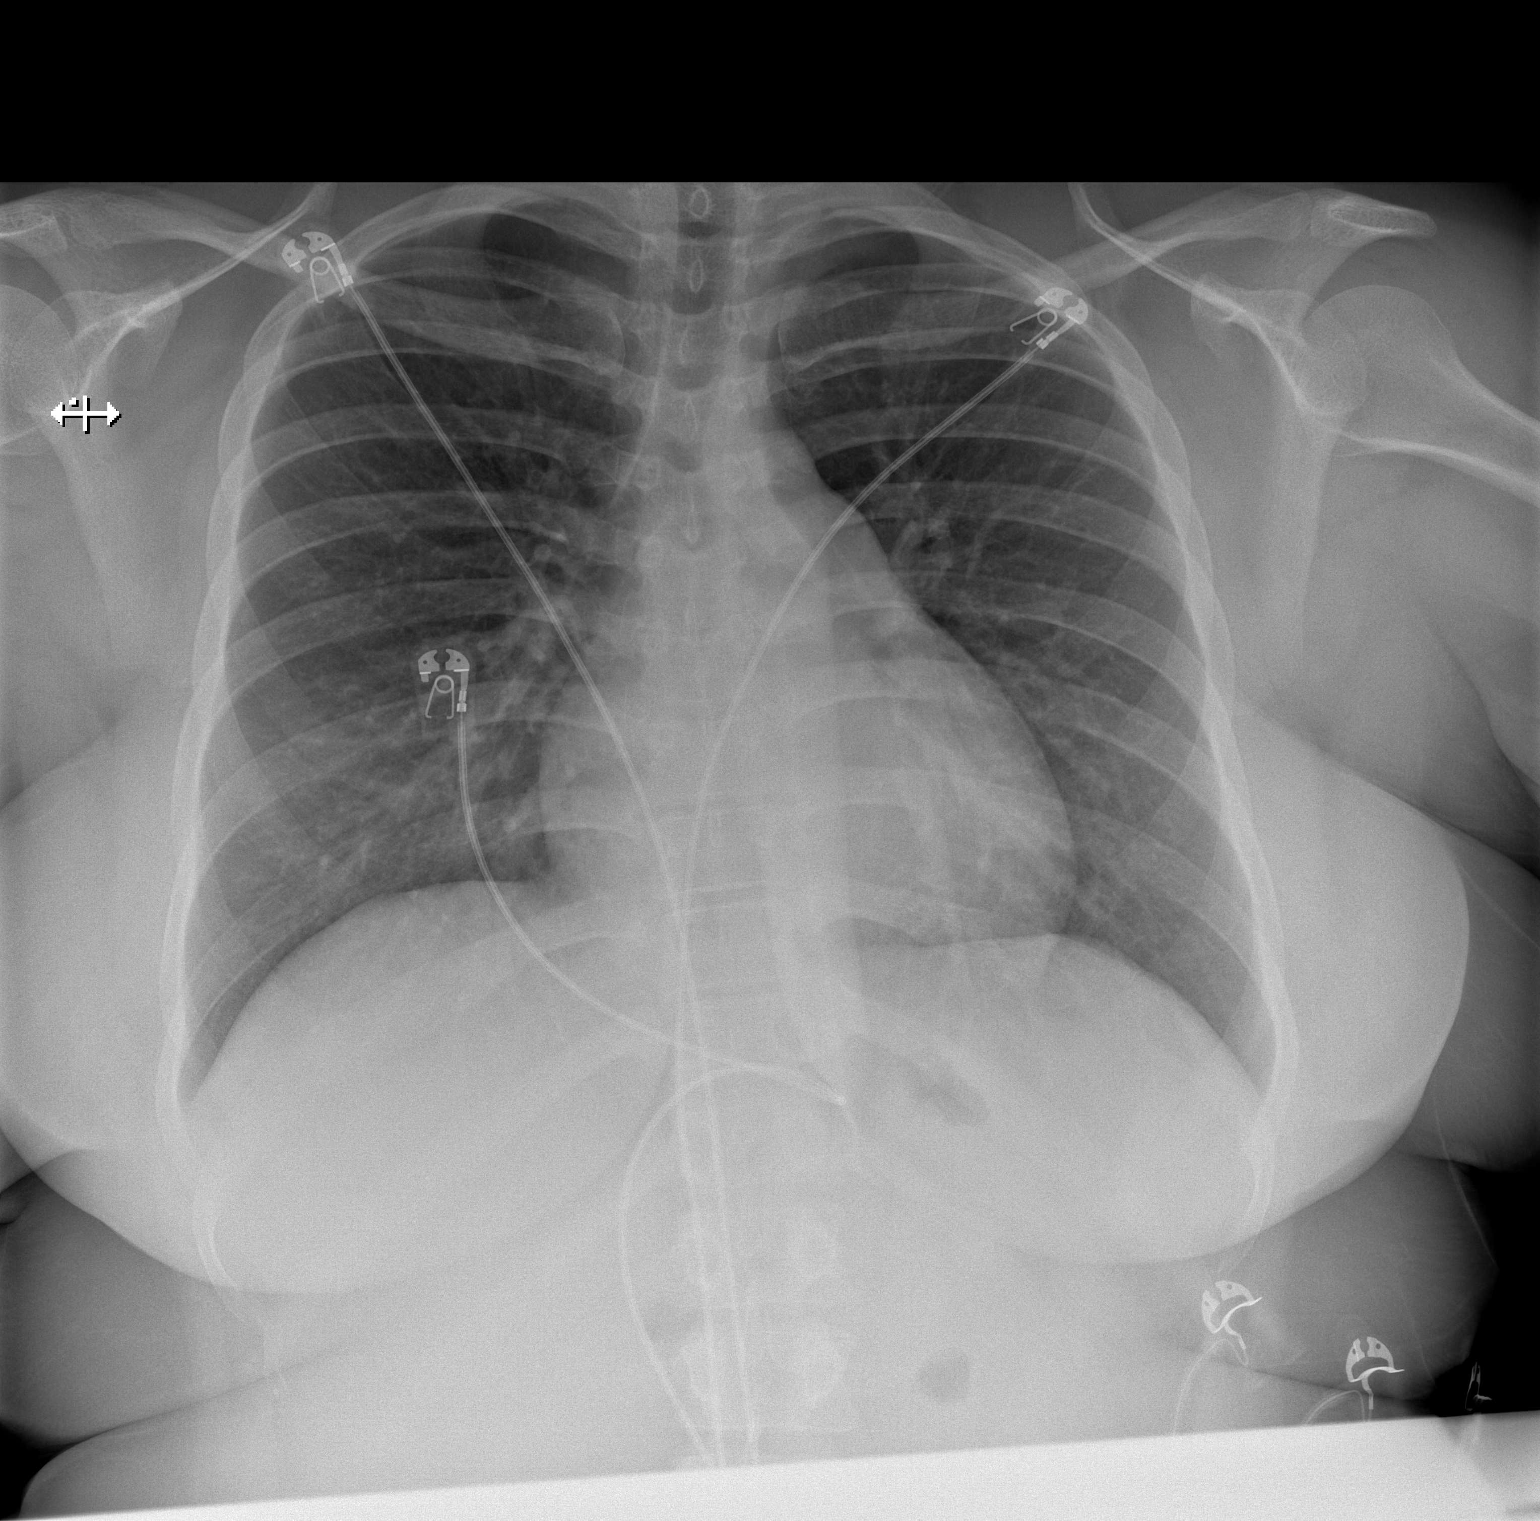

[w chest lat]
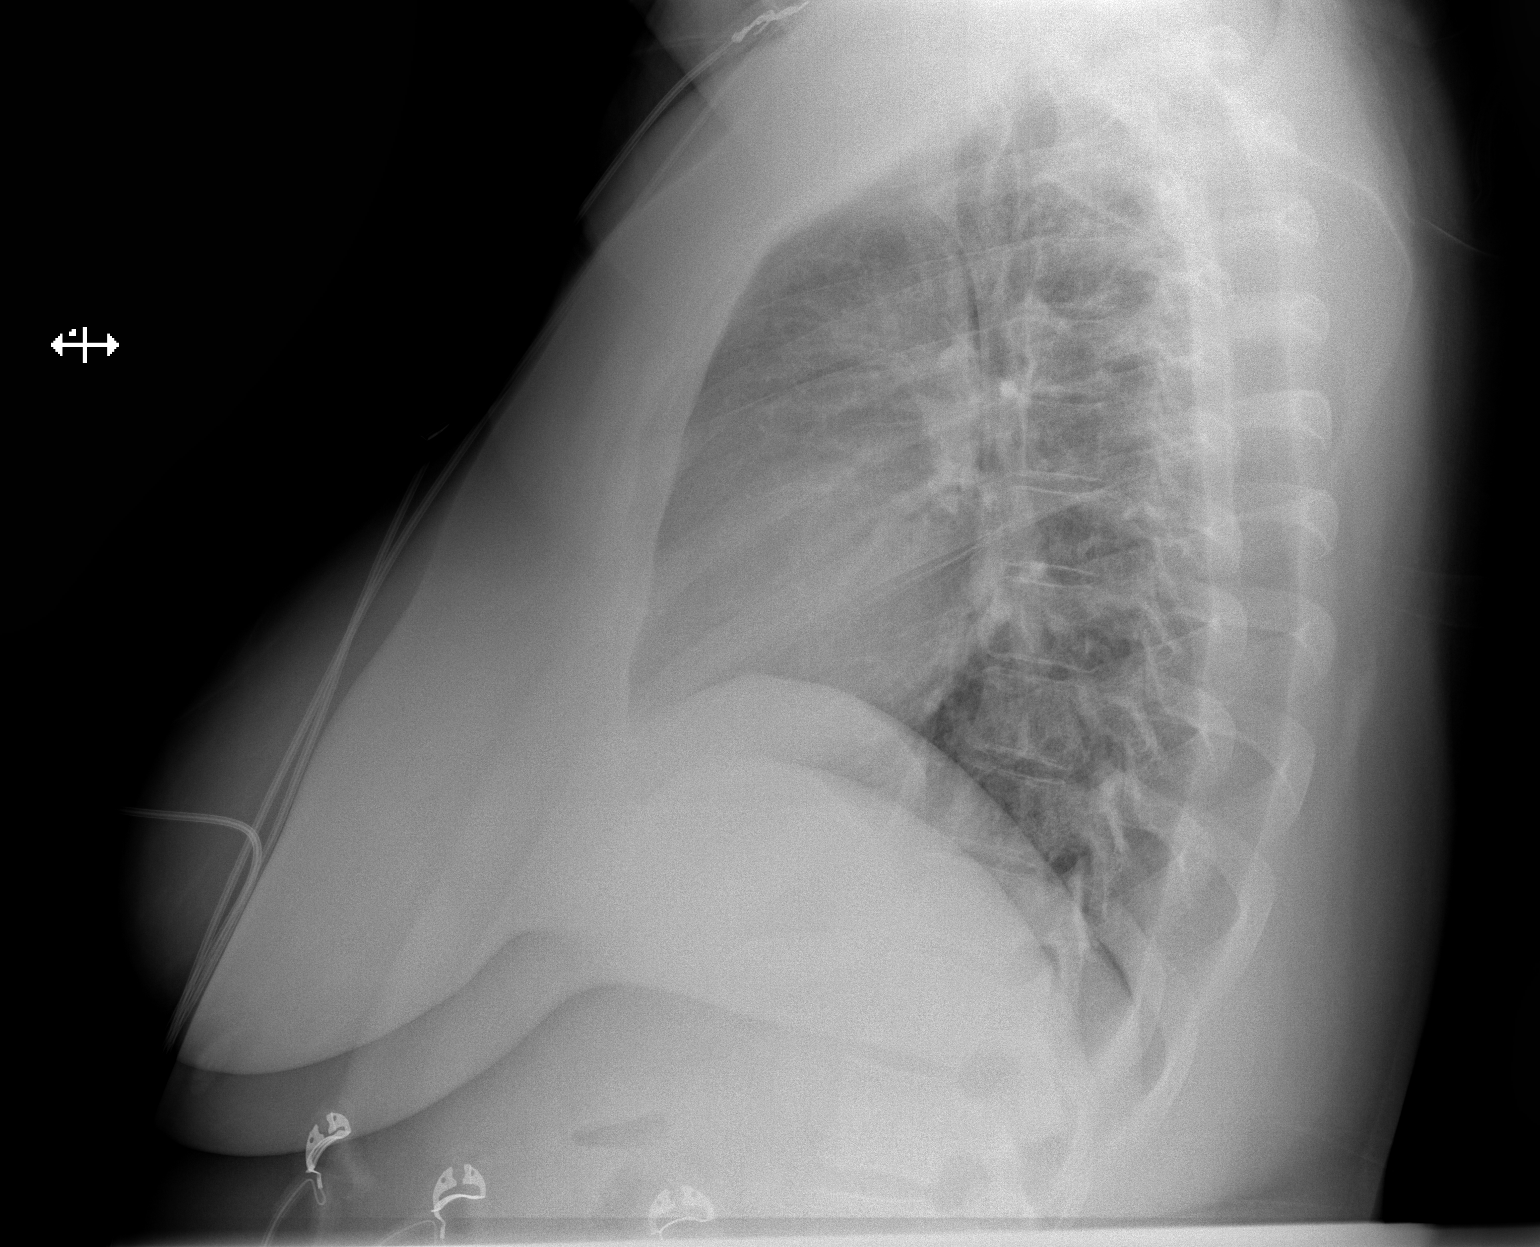

[2 of 2 positions shown; findings below may reference images not displayed]

FINDINGS: The heart size and mediastinal contours are within normal limits.
Both lungs are clear. The visualized skeletal structures are
unremarkable.
IMPRESSION: No active cardiopulmonary disease.

## 2023-08-24 ENCOUNTER — Emergency Department: Payer: Self-pay

## 2023-08-24 ENCOUNTER — Other Ambulatory Visit: Payer: Self-pay

## 2023-08-24 ENCOUNTER — Inpatient Hospital Stay
Admission: EM | Admit: 2023-08-24 | Discharge: 2023-08-26 | DRG: 638 | Disposition: A | Discharge status: Home / Self Care | Payer: Self-pay | Attending: Internal Medicine | Admitting: Internal Medicine

## 2023-08-24 DIAGNOSIS — E8729 Other acidosis: Secondary | ICD-10-CM | POA: Insufficient documentation

## 2023-08-24 DIAGNOSIS — Z6841 Body Mass Index (BMI) 40.0 and over, adult: Secondary | ICD-10-CM | POA: Diagnosis not present

## 2023-08-24 DIAGNOSIS — F17210 Nicotine dependence, cigarettes, uncomplicated: Secondary | ICD-10-CM | POA: Diagnosis present

## 2023-08-24 DIAGNOSIS — E872 Acidosis, unspecified: Secondary | ICD-10-CM | POA: Diagnosis not present

## 2023-08-24 DIAGNOSIS — R739 Hyperglycemia, unspecified: Principal | ICD-10-CM | POA: Diagnosis present

## 2023-08-24 DIAGNOSIS — E66813 Obesity, class 3: Secondary | ICD-10-CM | POA: Diagnosis present

## 2023-08-24 DIAGNOSIS — Z794 Long term (current) use of insulin: Secondary | ICD-10-CM | POA: Diagnosis not present

## 2023-08-24 DIAGNOSIS — E119 Type 2 diabetes mellitus without complications: Secondary | ICD-10-CM

## 2023-08-24 DIAGNOSIS — Z833 Family history of diabetes mellitus: Secondary | ICD-10-CM | POA: Diagnosis not present

## 2023-08-24 DIAGNOSIS — E1110 Type 2 diabetes mellitus with ketoacidosis without coma: Principal | ICD-10-CM | POA: Diagnosis present

## 2023-08-24 DIAGNOSIS — E1165 Type 2 diabetes mellitus with hyperglycemia: Secondary | ICD-10-CM | POA: Diagnosis not present

## 2023-08-24 HISTORY — DX: Morbid (severe) obesity due to excess calories: E66.01

## 2023-08-24 LAB — BASIC METABOLIC PANEL WITH GFR
Anion gap: 16 — ABNORMAL HIGH (ref 5–15)
Anion gap: 12 (ref 5–15)
BUN: 13 mg/dL (ref 6–20)
BUN: 10 mg/dL (ref 6–20)
CO2: 19 mmol/L — ABNORMAL LOW (ref 22–32)
CO2: 22 mmol/L (ref 22–32)
Calcium: 9.8 mg/dL (ref 8.9–10.3)
Calcium: 9.3 mg/dL (ref 8.9–10.3)
Chloride: 100 mmol/L (ref 98–111)
Chloride: 103 mmol/L (ref 98–111)
Creatinine, Ser: 0.82 mg/dL (ref 0.44–1.00)
Creatinine, Ser: 0.65 mg/dL (ref 0.44–1.00)
GFR, Estimated: >60 mL/min (ref >60)
GFR, Estimated: >60 mL/min (ref >60)
Glucose, Bld: 556 mg/dL (ref 70–99)
Glucose, Bld: 273 mg/dL — ABNORMAL HIGH (ref 70–99)
Potassium: 4.2 mmol/L (ref 3.5–5.1)
Potassium: 3.5 mmol/L (ref 3.5–5.1)
Sodium: 135 mmol/L (ref 135–145)
Sodium: 137 mmol/L (ref 135–145)

## 2023-08-24 LAB — HEMOGLOBIN A1C
Hgb A1c MFr Bld: 12 % — ABNORMAL HIGH (ref 4.8–5.6)
Mean Plasma Glucose: 297.7 mg/dL

## 2023-08-24 LAB — CBC
HCT: 44.1 % (ref 36.0–46.0)
Hemoglobin: 14.7 g/dL (ref 12.0–15.0)
MCH: 27.9 pg (ref 26.0–34.0)
MCHC: 33.3 g/dL (ref 30.0–36.0)
MCV: 83.8 fL (ref 80.0–100.0)
Platelets: 285 K/uL (ref 150–400)
RBC: 5.26 MIL/uL — ABNORMAL HIGH (ref 3.87–5.11)
RDW: 12.9 % (ref 11.5–15.5)
WBC: 5.5 K/uL (ref 4.0–10.5)
nRBC: 0 % (ref 0.0–0.2)

## 2023-08-24 LAB — URINALYSIS, ROUTINE W REFLEX MICROSCOPIC
Bilirubin Urine: NEGATIVE
Glucose, UA: >=500 mg/dL — AB
Hgb urine dipstick: NEGATIVE
Ketones, ur: 20 mg/dL — AB
Leukocytes,Ua: NEGATIVE
Nitrite: NEGATIVE
Protein, ur: 30 mg/dL — AB
Specific Gravity, Urine: 1.04 — ABNORMAL HIGH (ref 1.005–1.030)
pH: 5 (ref 5.0–8.0)

## 2023-08-24 LAB — GLUCOSE, CAPILLARY
Glucose-Capillary: 202 mg/dL — ABNORMAL HIGH (ref 70–99)
Glucose-Capillary: 229 mg/dL — ABNORMAL HIGH (ref 70–99)
Glucose-Capillary: 240 mg/dL — ABNORMAL HIGH (ref 70–99)
Glucose-Capillary: 246 mg/dL — ABNORMAL HIGH (ref 70–99)
Glucose-Capillary: 279 mg/dL — ABNORMAL HIGH (ref 70–99)
Glucose-Capillary: 329 mg/dL — ABNORMAL HIGH (ref 70–99)
Glucose-Capillary: 371 mg/dL — ABNORMAL HIGH (ref 70–99)
Glucose-Capillary: 437 mg/dL — ABNORMAL HIGH (ref 70–99)

## 2023-08-24 LAB — HEPATIC FUNCTION PANEL
ALT: 64 U/L — ABNORMAL HIGH (ref 0–44)
AST: 65 U/L — ABNORMAL HIGH (ref 15–41)
Albumin: 4.6 g/dL (ref 3.5–5.0)
Alkaline Phosphatase: 103 U/L (ref 38–126)
Bilirubin, Direct: 0.1 mg/dL (ref 0.0–0.2)
Indirect Bilirubin: 0.8 mg/dL (ref 0.3–0.9)
Total Bilirubin: 0.9 mg/dL (ref 0.0–1.2)
Total Protein: 8.4 g/dL — ABNORMAL HIGH (ref 6.5–8.1)

## 2023-08-24 LAB — D-DIMER, QUANTITATIVE: D-Dimer, Quant: <0.27 ug{FEU}/mL (ref 0.00–0.50)

## 2023-08-24 LAB — BLOOD GAS, VENOUS
Acid-base deficit: 0.2 mmol/L (ref 0.0–2.0)
Bicarbonate: 25.4 mmol/L (ref 20.0–28.0)
O2 Saturation: 86.3 %
Patient temperature: 37
pCO2, Ven: 44 mmHg (ref 44–60)
pH, Ven: 7.37 (ref 7.25–7.43)
pO2, Ven: 54 mmHg — ABNORMAL HIGH (ref 32–45)

## 2023-08-24 LAB — LACTIC ACID, PLASMA
Lactic Acid, Venous: 2.3 mmol/L (ref 0.5–1.9)
Lactic Acid, Venous: 1.8 mmol/L (ref 0.5–1.9)

## 2023-08-24 LAB — CBG MONITORING, ED: Glucose-Capillary: 527 mg/dL (ref 70–99)

## 2023-08-24 LAB — BETA-HYDROXYBUTYRIC ACID: Beta-Hydroxybutyric Acid: 0.98 mmol/L — ABNORMAL HIGH (ref 0.05–0.27)

## 2023-08-24 LAB — MRSA NEXT GEN BY PCR, NASAL: MRSA by PCR Next Gen: NOT DETECTED

## 2023-08-24 LAB — TROPONIN I (HIGH SENSITIVITY)
Troponin I (High Sensitivity): 5 ng/L (ref <18)
Troponin I (High Sensitivity): 4 ng/L (ref <18)

## 2023-08-24 LAB — LIPASE, BLOOD: Lipase: 48 U/L (ref 11–51)

## 2023-08-24 LAB — POC URINE PREG, ED: Preg Test, Ur: NEGATIVE

## 2023-08-24 MED ORDER — CHLORHEXIDINE GLUCONATE CLOTH 2 % EX PADS
6.0000 | MEDICATED_PAD | Freq: Every day | CUTANEOUS | Status: DC
  Administered 2023-08-24: 6 via TOPICAL

## 2023-08-24 MED ORDER — INSULIN ASPART 100 UNIT/ML IJ SOLN
10.0000 [IU] | Freq: Once | INTRAMUSCULAR | Status: AC
  Administered 2023-08-24: 10 [IU] via SUBCUTANEOUS
  Filled 2023-08-24: qty 1

## 2023-08-24 MED ORDER — SODIUM CHLORIDE 0.9% FLUSH: 10.0000 mL | INTRAVENOUS | Status: DC | PRN

## 2023-08-24 MED ORDER — DEXTROSE 50 % IV SOLN: 0.0000 mL | INTRAVENOUS | Status: DC | PRN

## 2023-08-24 MED ORDER — SODIUM CHLORIDE 0.9 % IV SOLN: INTRAVENOUS | Status: AC | PRN

## 2023-08-24 MED ORDER — HEPARIN SODIUM (PORCINE) 5000 UNIT/ML IJ SOLN
5000.0000 [IU] | Freq: Three times a day (TID) | INTRAMUSCULAR | Status: DC
  Administered 2023-08-24 – 2023-08-26 (×5): 5000 [IU] via SUBCUTANEOUS
  Filled 2023-08-24 (×6): qty 1

## 2023-08-24 MED ORDER — LACTATED RINGERS IV SOLN: INTRAVENOUS | Status: AC

## 2023-08-24 MED ORDER — ORAL CARE MOUTH RINSE: 15.0000 mL | OROMUCOSAL | Status: DC | PRN

## 2023-08-24 MED ORDER — LACTATED RINGERS IV BOLUS
1000.0000 mL | Freq: Once | INTRAVENOUS | Status: AC
  Administered 2023-08-24: 1000 mL via INTRAVENOUS

## 2023-08-24 MED ORDER — POTASSIUM CHLORIDE CRYS ER 20 MEQ PO TBCR
40.0000 meq | EXTENDED_RELEASE_TABLET | Freq: Once | ORAL | Status: AC
  Administered 2023-08-24: 40 meq via ORAL
  Filled 2023-08-24: qty 2

## 2023-08-24 MED ORDER — DEXTROSE IN LACTATED RINGERS 5 % IV SOLN: INTRAVENOUS | Status: AC

## 2023-08-24 MED ORDER — SODIUM CHLORIDE 0.9 % IV BOLUS
1000.0000 mL | Freq: Once | INTRAVENOUS | Status: AC
  Administered 2023-08-24: 1000 mL via INTRAVENOUS

## 2023-08-24 MED ORDER — INSULIN REGULAR(HUMAN) IN NACL 100-0.9 UT/100ML-% IV SOLN
INTRAVENOUS | Status: DC
  Administered 2023-08-24: 13 [IU]/h via INTRAVENOUS
  Administered 2023-08-25: 4.8 [IU]/h via INTRAVENOUS
  Filled 2023-08-24 (×2): qty 100

## 2023-08-24 MED ORDER — SODIUM CHLORIDE 0.9% FLUSH
10.0000 mL | Freq: Two times a day (BID) | INTRAVENOUS | Status: DC
  Administered 2023-08-25: 10 mL
  Administered 2023-08-25: 20 mL
  Administered 2023-08-26: 10 mL

## 2023-08-24 MED ORDER — POTASSIUM CHLORIDE 10 MEQ/100ML IV SOLN
10.0000 meq | INTRAVENOUS | Status: DC
  Administered 2023-08-24: 10 meq via INTRAVENOUS
  Filled 2023-08-24 (×4): qty 100

## 2023-08-24 NOTE — Assessment & Plan Note (Signed)
 Suspect secondary to hyper glycemia Continue treatment per above Recheck BMP every 4 hours

## 2023-08-24 NOTE — Assessment & Plan Note (Signed)
 Check A1c.

## 2023-08-24 NOTE — H&P (Signed)
 History and Physical   Shannon Santos FMW:[id number] DOB: 04-13-2000 DOA: 08/24/2023  PCP: Center, [facility] Patient coming from: Home  I have personally briefly reviewed patient's old medical records in [facility] EMR.  Chief Concern: Weakness, nausea, shortness of breath  HPI: Shannon Santos is a 23 year old female with morbid obesity, who presents ED for chief concern of nausea, shortness of breath, weakness.  Vitals in the ED showed T 97.5, respiration rate 20, heart rate 125, blood pressure 129/114, SpO2 97% on room air.  Serum sodium is 135, potassium 4.2, chloride 100, bicarb 19, BUN of 13, serum creatinine 0.82, EGFR greater than 60, nonfasting blood glucose 556.  Anion gap was elevated at 16. Patient had initial lactic acid of 2.3 and improved to 1.8.  HS troponin was 5 and on repeat is 4.  AST is 65, ALT is 64.  Beta-hydroxybutyrate was mildly elevated at 0.98.  UA was negative for nitrates and leukocytes.  Positive for rare bacteria.  Urine pregnancy test was negative.  ED treatment: Insulin  aspart 10 units one-time dose, sodium chloride  1 L bolus, lactated ringer  1 L bolus. --------------------------------------- At bedside, patient able to tell me her name, age, location, current calendar year.   She reports on Thursday, she developed increased urination and increased thirst. She denies chest pain, dysuria, hematuria, blood in her stool, diarrhea. She denies trauma to her person.   Social history: She lives at home with her boyfriend. She denies tobacco and etoh use. She infrequently smokes marijuana. She works in Engineering geologist at [employer].   ROS: Constitutional: no weight change, no fever ENT/Mouth: no sore throat, no rhinorrhea Eyes: no eye pain, no vision changes Cardiovascular: no chest pain, no dyspnea,  no edema, no palpitations Respiratory: no cough, no sputum, no wheezing Gastrointestinal: no nausea, no vomiting, no diarrhea, no constipation, +  polydipsia Genitourinary: no urinary incontinence, no dysuria, no hematuria, + polyuria Musculoskeletal: no arthralgias, no myalgias Skin: no skin lesions, no pruritus, Neuro: + weakness, no loss of consciousness, no syncope Psych: no anxiety, no depression, no decrease appetite Heme/Lymph: no bruising, no bleeding  ED Course: Discussed with EDP, patient requiring hospitalization for chief concerns of new onset diabetes mellitus with profound hyperglycemia.  Assessment/Plan  Principal Problem:   Hyperglycemia Active Problems:   Obesity, Class III, BMI 40-49.9 (morbid obesity)   Diabetes mellitus, new onset (HCC)   High anion gap metabolic acidosis   Assessment and Plan:  * Hyperglycemia Continue insulin  via Endo tool Continue with aggressive IV hydration Check BMP every 4 hours Check A1c Diabetes coordinator/educator consulted  High anion gap metabolic acidosis Suspect secondary to hyper glycemia Continue treatment per above Recheck BMP every 4 hours  Diabetes mellitus, new onset (HCC) Check A1c  Obesity, Class III, BMI 40-49.9 (morbid obesity) This complicates overall care and prognosis.  Counsel patient on weight loss  Chart reviewed.   DVT prophylaxis: Heparin  5000 units subcutaneous every 8 hours Code Status: full code  Diet: N.p.o. Family Communication: a phone call offered, she declined stating she can update herself  Disposition Plan: Pending clinical course Consults called: None at this time Admission status: Stepdown, inpatient  Past Medical History:  Diagnosis Date   Morbid obesity (HCC)    History reviewed. No pertinent surgical history.  Social History:  reports that she has been smoking cigarettes and cigars. She has never used smokeless tobacco. She reports that she does not drink alcohol and does not use drugs.  No Known Allergies Family History  Problem Relation Age of Onset   Diabetes Maternal Grandmother    Family history: Family  history reviewed and not pertinent.  Prior to Admission medications   Not on File   Physical Exam: Vitals:   08/24/23 1530 08/24/23 1726 08/24/23 1743 08/24/23 1800  BP: (!) 146/95 (!) 148/101 (!) 139/101 (!) 155/103  Pulse: 91 (!) 105    Resp:  17 16 19   Temp:  98.2 F (36.8 C)    TempSrc:  Oral    SpO2: 100% 99%    Weight:  111.8 kg    Height:  5' 4 (1.626 m)     Constitutional: appears stated age, NAD, calm Eyes: PERRL, lids and conjunctivae normal ENMT: Mucous membranes are moist. Posterior pharynx clear of any exudate or lesions. Age-appropriate dentition. Hearing appropriate Neck: normal, supple, no masses, no thyromegaly Respiratory: clear to auscultation bilaterally, no wheezing, no crackles. Normal respiratory effort. No accessory muscle use.  Cardiovascular: Regular rate and rhythm, no murmurs / rubs / gallops. No extremity edema. 2+ pedal pulses. No carotid bruits.  Abdomen: obese abdomen, no tenderness, no masses palpated, no hepatosplenomegaly. Bowel sounds positive.  Musculoskeletal: no clubbing / cyanosis. No joint deformity upper and lower extremities. Good ROM, no contractures, no atrophy. Normal muscle tone.  Skin: no rashes, lesions, ulcers. No induration Neurologic: Sensation intact. Strength 5/5 in all 4.  Psychiatric: Normal judgment and insight. Alert and oriented x 3. Normal mood.   EKG: independently reviewed, showing sinus tachycardia with rate of 119, QTc 450  Chest x-ray on Admission: I personally reviewed and I agree with radiologist reading as below.  DG Chest 2 View Result Date: 08/24/2023 CLINICAL DATA:  Shortness of breath and tachycardia.  Chest pain. EXAM: CHEST - 2 VIEW COMPARISON:  Chest radiograph dated 11/20/2020. FINDINGS: The heart size and mediastinal contours are within normal limits. Both lungs are clear. The visualized skeletal structures are unremarkable. IMPRESSION: No active cardiopulmonary disease. Electronically Signed   By: Vanetta Chou M.D.   On: 08/24/2023 13:38   Labs on Admission: I have personally reviewed following labs  CBC: Recent Labs  Lab 08/24/23 1321  WBC 5.5  HGB 14.7  HCT 44.1  MCV 83.8  PLT 285   Basic Metabolic Panel: Recent Labs  Lab 08/24/23 1321  NA 135  K 4.2  CL 100  CO2 19*  GLUCOSE 556*  BUN 13  CREATININE 0.82  CALCIUM 9.8   GFR: Estimated Creatinine Clearance: 129.4 mL/min (by C-G formula based on SCr of 0.82 mg/dL).  Liver Function Tests: Recent Labs  Lab 08/24/23 1406  AST 65*  ALT 64*  ALKPHOS 103  BILITOT 0.9  PROT 8.4*  ALBUMIN 4.6   Recent Labs  Lab 08/24/23 1406  LIPASE 48   CBG: Recent Labs  Lab 08/24/23 1628 08/24/23 1724 08/24/23 1759  GLUCAP 527* 437* 371*   Urine analysis:    Component Value Date/Time   COLORURINE YELLOW (A) 08/24/2023 1400   APPEARANCEUR CLEAR (A) 08/24/2023 1400   LABSPEC 1.040 (H) 08/24/2023 1400   PHURINE 5.0 08/24/2023 1400   GLUCOSEU >=500 (A) 08/24/2023 1400   HGBUR NEGATIVE 08/24/2023 1400   BILIRUBINUR NEGATIVE 08/24/2023 1400   KETONESUR 20 (A) 08/24/2023 1400   PROTEINUR 30 (A) 08/24/2023 1400   NITRITE NEGATIVE 08/24/2023 1400   LEUKOCYTESUR NEGATIVE 08/24/2023 1400   This document was prepared using Dragon Voice Recognition software and may include unintentional dictation errors.  Dr. Sherre [facility]  If 7PM-7AM, please contact  overnight-coverage provider If 7AM-7PM, please contact day attending provider [email or web address]  08/24/2023, 6:19 PM

## 2023-08-24 NOTE — ED Notes (Signed)
 Report given to [facility]

## 2023-08-24 NOTE — Hospital Course (Signed)
 Ms. Shannon Santos is a 23 year old female with morbid obesity, who presents ED for chief concern of nausea, shortness of breath, weakness.  Vitals in the ED showed T 97.5, respiration rate 20, heart rate 125, blood pressure 129/114, SpO2 97% on room air.  Serum sodium is 135, potassium 4.2, chloride 100, bicarb 19, BUN of 13, serum creatinine 0.82, EGFR greater than 60, nonfasting blood glucose 556.  Anion gap was elevated at 16. Patient had initial lactic acid of 2.3 and improved to 1.8.  HS troponin was 5 and on repeat is 4.  AST is 65, ALT is 64.  Beta-hydroxybutyrate was mildly elevated at 0.98.  UA was negative for nitrates and leukocytes.  Positive for rare bacteria.  Urine pregnancy test was negative.  ED treatment: Insulin  aspart 10 units one-time dose, sodium chloride  1 L bolus, lactated ringer  1 L bolus.

## 2023-08-24 NOTE — ED Provider Notes (Signed)
 [facility] Provider Note    Event Date/Time   First MD Initiated Contact with Patient 08/24/23 1344     (approximate)   History   Nausea, Shortness of Breath, and Tachycardia   HPI  Shannon Santos is a 23 y.o. female with no reported past medical history presents today for evaluation of multiple symptoms.  Patient reports that yesterday she began to feel weak while she was at work, where she works sitting down at [employer].  She reports that she subsequently developed shortness of breath and felt like her heart was racing.  She also feels that she has been urinating more frequently and feels very thirsty.  She denies having chest pain.  She reports that she feels slightly nauseated but has not had any vomiting.  She has no personal or family history of PE or DVT.  She has not had any calf or leg swelling.  She does not take any exogenous hormones.  She has not had any recent trips or travel or periods of immobilization or recent hospitalizations.  She denies cough or hemoptysis.  She denies fever, chills, or bodyaches.  She has not had any recent tick borne exposures.  She reports that she recently started a new job this week in which she wakes up at 5 AM so she began to drink coffee which she has never had before.  She reports that she occasionally smokes marijuana and cigarettes, denies any other substance use or alcohol use.  Patient Active Problem List   Diagnosis Date Noted   Hyperglycemia 08/24/2023   Obesity, Class III, BMI 40-49.9 (morbid obesity) 08/24/2023   Diabetes mellitus, new onset (HCC) 08/24/2023   High anion gap metabolic acidosis 08/24/2023          Physical Exam   Triage Vital Signs: ED Triage Vitals  Encounter Vitals Group     BP 08/24/23 1316 (!) 129/114     Girls Systolic BP Percentile --      Girls Diastolic BP Percentile --      Boys Systolic BP Percentile --      Boys Diastolic BP Percentile --      Pulse Rate 08/24/23 1316  (!) 125     Resp 08/24/23 1316 20     Temp 08/24/23 1316 (!) 97.5 F (36.4 C)     Temp Source 08/24/23 1316 Oral     SpO2 08/24/23 1316 97 %     Weight 08/24/23 1318 260 lb (117.9 kg)     Height 08/24/23 1318 5' 4 (1.626 m)     Head Circumference --      Peak Flow --      Pain Score 08/24/23 1317 0     Pain Loc --      Pain Education --      Exclude from Growth Chart --     Most recent vital signs: Vitals:   08/24/23 1316 08/24/23 1530  BP: (!) 129/114 (!) 146/95  Pulse: (!) 125 91  Resp: 20   Temp: (!) 97.5 F (36.4 C)   SpO2: 97% 100%    Physical Exam Vitals and nursing note reviewed.  Constitutional:      General: Awake and alert. No acute distress.    Appearance: Normal appearance. The patient is overweight.  HENT:     Head: Normocephalic and atraumatic.     Mouth: Mucous membranes are moist.  Eyes:     General: PERRL. Normal EOMs  Right eye: No discharge.        Left eye: No discharge.     Conjunctiva/sclera: Conjunctivae normal.  Cardiovascular:     Rate and Rhythm: Tachycardic rate and regular rhythm.     Pulses: Normal pulses.  Pulmonary:     Effort: Pulmonary effort is normal. No respiratory distress.     Breath sounds: Normal breath sounds.  Abdominal:     Abdomen is soft. There is no abdominal tenderness. No rebound or guarding. No distention. Musculoskeletal:        General: No swelling. Normal range of motion.     Cervical back: Normal range of motion and neck supple.  No lower extremity swelling Skin:    General: Skin is warm and dry.     Capillary Refill: Capillary refill takes less than 2 seconds.     Findings: No rash.  Neurological:     Mental Status: The patient is awake and alert.      ED Results / Procedures / Treatments   Labs (all labs ordered are listed, but only abnormal results are displayed) Labs Reviewed  BASIC METABOLIC PANEL WITH GFR - Abnormal; Notable for the following components:      Result Value   CO2 19 (*)     Glucose, Bld 556 (*)    Anion gap 16 (*)    All other components within normal limits  CBC - Abnormal; Notable for the following components:   RBC 5.26 (*)    All other components within normal limits  URINALYSIS, ROUTINE W REFLEX MICROSCOPIC - Abnormal; Notable for the following components:   Color, Urine YELLOW (*)    APPearance CLEAR (*)    Specific Gravity, Urine 1.040 (*)    Glucose, UA >=500 (*)    Ketones, ur 20 (*)    Protein, ur 30 (*)    Bacteria, UA RARE (*)    All other components within normal limits  LACTIC ACID, PLASMA - Abnormal; Notable for the following components:   Lactic Acid, Venous 2.3 (*)    All other components within normal limits  HEPATIC FUNCTION PANEL - Abnormal; Notable for the following components:   Total Protein 8.4 (*)    AST 65 (*)    ALT 64 (*)    All other components within normal limits  BLOOD GAS, VENOUS - Abnormal; Notable for the following components:   pO2, Ven 54 (*)    All other components within normal limits  BETA-HYDROXYBUTYRIC ACID - Abnormal; Notable for the following components:   Beta-Hydroxybutyric Acid 0.98 (*)    All other components within normal limits  CBG MONITORING, ED - Abnormal; Notable for the following components:   Glucose-Capillary 527 (*)    All other components within normal limits  D-DIMER, QUANTITATIVE  LACTIC ACID, PLASMA  LIPASE, BLOOD  HEMOGLOBIN A1C  BASIC METABOLIC PANEL WITH GFR  BASIC METABOLIC PANEL WITH GFR  BASIC METABOLIC PANEL WITH GFR  POC URINE PREG, ED  TROPONIN I (HIGH SENSITIVITY)  TROPONIN I (HIGH SENSITIVITY)     EKG     RADIOLOGY I independently reviewed and interpreted imaging and agree with radiologists findings.     PROCEDURES:  Critical Care performed:   .Critical Care  Performed by: Velora Horstman E, PA-C Authorized by: Muaz Shorey E, PA-C   Critical care provider statement:    Critical care time (minutes):  40   Critical care was necessary to treat or  prevent imminent or life-threatening deterioration of the following conditions:  Dehydration and endocrine crisis   Critical care was time spent personally by me on the following activities:  Development of treatment plan with patient or surrogate, discussions with consultants, evaluation of patient's response to treatment, examination of patient, ordering and review of laboratory studies, ordering and review of radiographic studies, ordering and performing treatments and interventions, pulse oximetry, re-evaluation of patient's condition, review of old charts and obtaining history from patient or surrogate   I assumed direction of critical care for this patient from another provider in my specialty: no     Care discussed with: admitting provider      MEDICATIONS ORDERED IN ED: Medications  insulin  regular, human (MYXREDLIN ) 100 units/ 100 mL infusion (13 Units/hr Intravenous New Bag/Given 08/24/23 1658)  lactated ringers  infusion (has no administration in time range)  dextrose  5 % in lactated ringers  infusion (has no administration in time range)  dextrose  50 % solution 0-50 mL (has no administration in time range)  heparin  injection 5,000 Units (has no administration in time range)  sodium chloride  0.9 % bolus 1,000 mL (0 mLs Intravenous Stopped 08/24/23 1702)  lactated ringers  bolus 1,000 mL (1,000 mLs Intravenous New Bag/Given 08/24/23 1536)  insulin  aspart (novoLOG ) injection 10 Units (10 Units Subcutaneous Given 08/24/23 1605)     IMPRESSION / MDM / ASSESSMENT AND PLAN / ED COURSE  I reviewed the triage vital signs and the nursing notes.   Differential diagnosis includes, but is not limited to, hyperglycemia, DKA, electrolyte disarray, UTI, pulmonary embolism.  Patient is awake and alert, tachycardic to 125 on arrival though normotensive and afebrile.  She has normal oxygen saturation 97% on room air.  Further workup is indicated.  Labs obtained reveal a glucose of 556, bicarb of 19,  and anion gap of 16.  Lactate is elevated at 2.3.  Troponin obtained in triage is normal, D-dimer is negative.  I subsequently added on beta hydroxybutyrate and VBG.  VBG reveals a normal pH, beta hydroxybutyrate is mildly elevated as expected.  She also has ketones in her urine.  Patient was hydrated with 2 L of IV fluids, lactate normalized.  Urinalysis is not suggestive of infection.  Urine pregnancy is negative.  No altered mental status/encephalopathy no profound weakness to suggest HHS.  No chest pain, no exertional symptoms, no clinical signs or symptoms of DVT.  Given her negative D-dimer, I do not suspect pulmonary embolism.  Given that this is a new diagnosis of diabetes and given that her blood sugar is very uncontrolled and that she has no outpatient follow-up, recommended admission to the hospitalist service for glucose control and diabetes medication initiation, possible diabetes coordinator consult.  I consulted the hospitalist for admission.  Dr. Greig Free has accepted the patient.  Patient is in agreement with plan.  Patient was also discussed with Dr. Clarine who agrees with assessment and plan.   Patient's presentation is most consistent with acute presentation with potential threat to life or bodily function.    FINAL CLINICAL IMPRESSION(S) / ED DIAGNOSES   Final diagnoses:  Hyperglycemia  High anion gap metabolic acidosis     Rx / DC Orders   ED Discharge Orders     None        Note:  This document was prepared using Dragon voice recognition software and may include unintentional dictation errors.   Kameelah Minish E, PA-C 08/24/23 1720    Clarine Ozell LABOR, MD 08/24/23 [phone]

## 2023-08-24 NOTE — ED Triage Notes (Signed)
 Pt to ED for nausea, SOB, heart beating fast, and body aches since yesterday. Denies inspiratory pain and chest pain. HR is 125. Respirations are unlabored.

## 2023-08-24 NOTE — Assessment & Plan Note (Addendum)
 This complicates overall care and prognosis.  Counsel patient on weight loss

## 2023-08-24 NOTE — Progress Notes (Signed)

## 2023-08-24 NOTE — ED Notes (Signed)
Blue top sent with labs. ?

## 2023-08-24 NOTE — ED Notes (Signed)
 Jenna Poggi PA-C aware of Lactic of 2.3

## 2023-08-24 NOTE — Assessment & Plan Note (Signed)
 Continue insulin  via Endo tool Continue with aggressive IV hydration Check BMP every 4 hours Check A1c Diabetes coordinator/educator consulted

## 2023-08-25 LAB — BASIC METABOLIC PANEL WITH GFR
Anion gap: 12 (ref 5–15)
Anion gap: 8 (ref 5–15)
Anion gap: 9 (ref 5–15)
BUN: 10 mg/dL (ref 6–20)
BUN: 10 mg/dL (ref 6–20)
BUN: 10 mg/dL (ref 6–20)
CO2: 23 mmol/L (ref 22–32)
CO2: 23 mmol/L (ref 22–32)
CO2: 24 mmol/L (ref 22–32)
Calcium: 8.9 mg/dL (ref 8.9–10.3)
Calcium: 8.9 mg/dL (ref 8.9–10.3)
Calcium: 9 mg/dL (ref 8.9–10.3)
Chloride: 103 mmol/L (ref 98–111)
Chloride: 104 mmol/L (ref 98–111)
Chloride: 106 mmol/L (ref 98–111)
Creatinine, Ser: 0.55 mg/dL (ref 0.44–1.00)
Creatinine, Ser: 0.65 mg/dL (ref 0.44–1.00)
Creatinine, Ser: 0.67 mg/dL (ref 0.44–1.00)
GFR, Estimated: >60 mL/min (ref >60)
GFR, Estimated: >60 mL/min (ref >60)
GFR, Estimated: >60 mL/min (ref >60)
Glucose, Bld: 163 mg/dL — ABNORMAL HIGH (ref 70–99)
Glucose, Bld: 198 mg/dL — ABNORMAL HIGH (ref 70–99)
Glucose, Bld: 237 mg/dL — ABNORMAL HIGH (ref 70–99)
Potassium: 3.7 mmol/L (ref 3.5–5.1)
Potassium: 3.7 mmol/L (ref 3.5–5.1)
Potassium: 3.9 mmol/L (ref 3.5–5.1)
Sodium: 137 mmol/L (ref 135–145)
Sodium: 137 mmol/L (ref 135–145)
Sodium: 138 mmol/L (ref 135–145)

## 2023-08-25 LAB — MAGNESIUM: Magnesium: 2 mg/dL (ref 1.7–2.4)

## 2023-08-25 LAB — GLUCOSE, CAPILLARY
Glucose-Capillary: 158 mg/dL — ABNORMAL HIGH (ref 70–99)
Glucose-Capillary: 160 mg/dL — ABNORMAL HIGH (ref 70–99)
Glucose-Capillary: 169 mg/dL — ABNORMAL HIGH (ref 70–99)
Glucose-Capillary: 180 mg/dL — ABNORMAL HIGH (ref 70–99)
Glucose-Capillary: 195 mg/dL — ABNORMAL HIGH (ref 70–99)
Glucose-Capillary: 197 mg/dL — ABNORMAL HIGH (ref 70–99)
Glucose-Capillary: 203 mg/dL — ABNORMAL HIGH (ref 70–99)
Glucose-Capillary: 226 mg/dL — ABNORMAL HIGH (ref 70–99)
Glucose-Capillary: 255 mg/dL — ABNORMAL HIGH (ref 70–99)
Glucose-Capillary: 257 mg/dL — ABNORMAL HIGH (ref 70–99)
Glucose-Capillary: 283 mg/dL — ABNORMAL HIGH (ref 70–99)
Glucose-Capillary: 344 mg/dL — ABNORMAL HIGH (ref 70–99)

## 2023-08-25 LAB — PHOSPHORUS: Phosphorus: 3.4 mg/dL (ref 2.5–4.6)

## 2023-08-25 MED ORDER — INSULIN GLARGINE 100 UNITS/ML SOLOSTAR PEN: 30.0000 [IU] | PEN_INJECTOR | Freq: Once | SUBCUTANEOUS | Status: DC

## 2023-08-25 MED ORDER — PANTOPRAZOLE SODIUM 40 MG PO TBEC
40.0000 mg | DELAYED_RELEASE_TABLET | Freq: Every day | ORAL | Status: DC
  Administered 2023-08-25 – 2023-08-26 (×2): 40 mg via ORAL
  Filled 2023-08-25 (×2): qty 1

## 2023-08-25 MED ORDER — LACTATED RINGERS IV SOLN: INTRAVENOUS | Status: AC

## 2023-08-25 MED ORDER — INSULIN GLARGINE-YFGN 100 UNIT/ML ~~LOC~~ SOLN
30.0000 [IU] | Freq: Once | SUBCUTANEOUS | Status: AC
  Administered 2023-08-25: 30 [IU] via SUBCUTANEOUS
  Filled 2023-08-25: qty 0.3

## 2023-08-25 MED ORDER — ALUM & MAG HYDROXIDE-SIMETH 200-200-20 MG/5ML PO SUSP
30.0000 mL | Freq: Four times a day (QID) | ORAL | Status: DC | PRN
  Administered 2023-08-26: 30 mL via ORAL
  Filled 2023-08-25: qty 30

## 2023-08-25 MED ORDER — INSULIN GLARGINE-YFGN 100 UNIT/ML ~~LOC~~ SOLN
40.0000 [IU] | Freq: Every day | SUBCUTANEOUS | Status: DC
  Administered 2023-08-26: 40 [IU] via SUBCUTANEOUS
  Filled 2023-08-25: qty 0.4

## 2023-08-25 MED ORDER — INSULIN GLARGINE-YFGN 100 UNIT/ML ~~LOC~~ SOLN
10.0000 [IU] | Freq: Every day | SUBCUTANEOUS | Status: DC
  Administered 2023-08-25: 10 [IU] via SUBCUTANEOUS
  Filled 2023-08-25: qty 0.1

## 2023-08-25 MED ORDER — LIVING WELL WITH DIABETES BOOK
Freq: Once | Status: AC
  Filled 2023-08-25: qty 1

## 2023-08-25 MED ORDER — POTASSIUM CHLORIDE CRYS ER 20 MEQ PO TBCR
40.0000 meq | EXTENDED_RELEASE_TABLET | Freq: Once | ORAL | Status: AC
  Administered 2023-08-25: 40 meq via ORAL
  Filled 2023-08-25: qty 2

## 2023-08-25 MED ORDER — ACETAMINOPHEN 325 MG PO TABS
650.0000 mg | ORAL_TABLET | Freq: Four times a day (QID) | ORAL | Status: DC | PRN
  Administered 2023-08-25: 650 mg via ORAL
  Filled 2023-08-25: qty 2

## 2023-08-25 MED ORDER — INSULIN ASPART 100 UNIT/ML IJ SOLN
0.0000 [IU] | Freq: Three times a day (TID) | INTRAMUSCULAR | Status: DC
  Administered 2023-08-25 (×2): 11 [IU] via SUBCUTANEOUS
  Administered 2023-08-25: 15 [IU] via SUBCUTANEOUS
  Administered 2023-08-25: 4 [IU] via SUBCUTANEOUS
  Administered 2023-08-26: 7 [IU] via SUBCUTANEOUS
  Filled 2023-08-25 (×5): qty 1

## 2023-08-25 NOTE — Plan of Care (Signed)
   Problem: Coping: Goal: Ability to adjust to condition or change in health will improve Outcome: Progressing   Problem: Fluid Volume: Goal: Ability to maintain a balanced intake and output will improve Outcome: Progressing

## 2023-08-25 NOTE — Progress Notes (Signed)
 Patient's visitor has asked nurse tech about feeding patient. Nurse has explained that the patient will not be able to eat until she has reached glucose goal ( the insulin  drip has been discontinued). Patient's glucose level has had some elevated results. Questioned patient if she has had anything to eat or drink, she denied. Will continue to educate patient and significant other about treatment.

## 2023-08-25 NOTE — TOC Initial Note (Addendum)
 Transition of Care [facility]) - Initial/Assessment Note    Patient Details  Name: Kyoko Elsea MRN: [id number] Date of Birth: 2000/05/03  Transition of Care [facility]) CM/SW Contact:    [place] L Natlie Asfour, LCSW Phone Number: 08/25/2023, 9:16 AM  Clinical Narrative:                  [facility] consult received. Patient is in need of a PCP. Resources added to the AVS for patient.   Secure email sent to the [facility] financial navigators for assistance.        Patient Goals and CMS Choice            Expected Discharge Plan and Services                                              Prior Living Arrangements/Services                       Activities of Daily Living   ADL Screening (condition at time of admission) Independently performs ADLs?: Yes (appropriate for developmental age) Is the patient deaf or have difficulty hearing?: No Does the patient have difficulty seeing, even when wearing glasses/contacts?: No Does the patient have difficulty concentrating, remembering, or making decisions?: No  Permission Sought/Granted                  Emotional Assessment              Admission diagnosis:  Hyperglycemia [R73.9] Patient Active Problem List   Diagnosis Date Noted   Hyperglycemia 08/24/2023   Obesity, Class III, BMI 40-49.9 (morbid obesity) 08/24/2023   Diabetes mellitus, new onset (HCC) 08/24/2023   High anion gap metabolic acidosis 08/24/2023   PCP:  [facility]:   [id number] [place], [place] - [address] AT [facility] OF [facility] & [address] [place] [address] Phone: [phone] Fax: [phone]  [id number] - [place], [place] - [address] [place] [address] Phone: [phone] Fax: [phone]     Social Drivers of Health (SDOH) Social History: SDOH Screenings   Food Insecurity: No Food Insecurity (08/24/2023)  Housing: High Risk  (08/24/2023)  Transportation Needs: No Transportation Needs (08/24/2023)  Utilities: Not At Risk (08/24/2023)  Tobacco Use: High Risk (08/24/2023)   SDOH Interventions:     Readmission Risk Interventions     No data to display

## 2023-08-25 NOTE — Progress Notes (Signed)
 Patient was able to demonstrate finger stick and insulin  administration. Receptive to teaching. Questions answered. Will continue to educate throughout shift.

## 2023-08-25 NOTE — Progress Notes (Signed)
 Progress Note   Patient: Shannon Santos FMW:[id number] DOB: April 29, 2000 DOA: 08/24/2023     1 DOS: the patient was seen and examined on 08/25/2023   Brief hospital course:  Ms. Dailyn Glaab is a 23 year old female with morbid obesity, who presents ED for chief concern of nausea, shortness of breath, weakness.   Vitals in the ED showed T 97.5, respiration rate 20, heart rate 125, blood pressure 129/114, SpO2 97% on room air.   Serum sodium is 135, potassium 4.2, chloride 100, bicarb 19, BUN of 13, serum creatinine 0.82, EGFR greater than 60, nonfasting blood glucose 556.   Anion gap was elevated at 16. Patient had initial lactic acid of 2.3 and improved to 1.8.  HS troponin was 5 and on repeat is 4.   AST is 65, ALT is 64.  Beta-hydroxybutyrate was mildly elevated at 0.98.  UA was negative for nitrates and leukocytes.  Positive for rare bacteria.   Urine pregnancy test was negative.   ED treatment: Insulin  aspart 10 units one-time dose, sodium chloride  1 L bolus, lactated ringer  1 L bolus. --------------------------------------- At bedside, patient able to tell me her name, age, location, current calendar year.    She reports on Thursday, she developed increased urination and increased thirst. She denies chest pain, dysuria, hematuria, blood in her stool, diarrhea. She denies trauma to her person.    Assessment and Plan:   Diabetic ketoacidosis New onset DM Hyperglycemia Patient was initially placed on insulin  drip for new onset diabetes mellitus with complications of DKA and has been weaned off and is currently on Lantus  which has been increased to 40 units daily with sliding scale insulin  Hemoglobin A1c is elevated at 12 Diabetic education Consult diabetic educator Will need assistance with medication    Morbid obesity (BMI 42.31) Obesity, class III Complicates overall prognosis and care      Subjective: No new complaints  Physical Exam: Vitals:   08/25/23 0700  08/25/23 0745 08/25/23 0800 08/25/23 1106  BP: (!) 133/97  (!) 128/91 136/88  Pulse: 95 90 (!) 101 86  Resp: 17 20 18 16   Temp:    98.5 F (36.9 C)  TempSrc:      SpO2: 98% 98% 99% 99%  Weight:      Height:       Constitutional: appears stated age, NAD, calm Eyes: PERRL, lids and conjunctivae normal ENMT: Mucous membranes are moist. Posterior pharynx clear of any exudate or lesions. Age-appropriate dentition. Hearing appropriate Neck: normal, supple, no masses, no thyromegaly Respiratory: clear to auscultation bilaterally, no wheezing, no crackles. Normal respiratory effort. No accessory muscle use.  Cardiovascular: Regular rate and rhythm, no murmurs / rubs / gallops. No extremity edema. 2+ pedal pulses. No carotid bruits.  Abdomen: obese abdomen, no tenderness, no masses palpated, no hepatosplenomegaly. Bowel sounds positive.  Musculoskeletal: no clubbing / cyanosis. No joint deformity upper and lower extremities. Good ROM, no contractures, no atrophy. Normal muscle tone.  Skin: no rashes, lesions, ulcers. No induration Neurologic: Sensation intact. Strength 5/5 in all 4.  Psychiatric: Normal judgment and insight. Alert and oriented x 3. Normal mood.    Data Reviewed: Labs reviewed.  Hyperglycemia persists. Labs reviewed  Family Communication: Plan of care discussed with patient at the bedside.  Will transfer to medical telemetry  Disposition: Status is: Inpatient Remains inpatient appropriate because: For discharge in the a.m. Requires medication assistance  Planned Discharge Destination: Home    Time spent: 40 minutes  Author: Aimee Somerset, MD 08/25/2023 12:09  PM  For on call review [email or web address].

## 2023-08-25 NOTE — Discharge Instructions (Addendum)
 [facility]  [email or web address]   Provider options in the area  [facility]  [address] [place], [place] [address] [phone]  [facility] [address] [place], [place] [address] [phone]  [facility] Description: Free clinic for the many people that do not have health care/insurance in [place] . Volunteers provide assistance by interacting with families and children of patients as they wait to be seen by Tourist information centre manager. Hours for clinic are Tuesday/Thursday evenings. Primary Contact: Shannon Santos Mailing Address: [address] [place], [place] [address] Phone #: [phone]  [email or web address]  [facility] by [facility] [address] [place], [address] Main [phone] Pharmacy [phone] Dental [phone] Fax [phone] After Hours [phone]  Plate Method for Diabetes   Foods with carbohydrates make your blood glucose level go up. The plate method is a simple way to meal plan and control the amount of carbohydrate you eat.         Use the following guidance to build a healthy plate to control carbohydrates. Divide a 9-inch plate into 3 sections, and consider your beverage the 4th section of your meal: Food Group Examples of Foods/Beverages for This Section of your Meal  Section 1: Non-starchy vegetables Fill  of your plate to include non-starchy vegetables Asparagus, broccoli, brussels sprouts, cabbage, carrots, cauliflower, celery, cucumber, green beans, mushrooms, peppers, salad greens, tomatoes, or zucchini.  Section 2: Protein foods Fill  of your plate to include a lean protein Lean meat, poultry, fish, seafood, cheese, eggs, lean deli meat, tofu, beans, lentils, nuts or nut butters.  Section 3: Carbohydrate foods Fill  of your plate to include carbohydrate foods Whole  grains, whole wheat bread, brown rice, whole grain pasta, polenta, corn tortillas, fruit, or starchy vegetables (potatoes, green peas, corn, beans, acorn squash, and butternut squash). One cup of milk also counts as a food that contains carbohydrate.  Section 4: Beverage Choose water or a low-calorie drink for your beverage. Unsweetened tea, coffee, or flavored/sparkling water without added sugar.  Image reprinted with permission from The American Diabetes Association.  Copyright 2022 by the American Diabetes Association.   Copyright 2022  Academy of Nutrition and Dietetics. All rights reserved    Carbohydrate Counting For People With Diabetes  Foods with carbohydrates make your blood glucose level go up. Learning how to count carbohydrates can help you control your blood glucose levels. First, identify the foods you eat that contain carbohydrates. Then, using the Foods with Carbohydrates chart, determine about how much carbohydrates are in your meals and snacks. Make sure you are eating foods with fiber, protein, and healthy fat along with your carbohydrate foods. Foods with Carbohydrates The following table shows carbohydrate foods that have about 15 grams of carbohydrate each. Using measuring cups, spoons, or a food scale when you first begin learning about carbohydrate counting can help you learn about the portion sizes you typically eat. The following foods have 15 grams carbohydrate each:  Grains 1 slice bread (1 ounce)  1 small tortilla (6-inch size)   large bagel (1 ounce)  1/3 cup pasta or rice (cooked)   hamburger or hot dog bun ( ounce)   cup cooked cereal   to  cup ready-to-eat cereal  2 taco shells (5-inch size) Fruit 1 small fresh fruit ( to 1 cup)   medium banana  17 small grapes (3  ounces)  1 cup melon or berries   cup canned or frozen fruit  2 tablespoons dried fruit (blueberries, cherries, cranberries, raisins)   cup unsweetened fruit juice  Starchy  Vegetables  cup cooked beans, peas, corn, potatoes/sweet potatoes   large baked potato (3 ounces)  1 cup acorn or butternut squash  Snack Foods 3 to 6 crackers  8 potato chips or 13 tortilla chips ( ounce to 1 ounce)  3 cups popped popcorn  Dairy 3/4 cup (6 ounces) nonfat plain yogurt, or yogurt with sugar-free sweetener  1 cup milk  1 cup plain rice, soy, coconut or flavored almond milk Sweets and Desserts  cup ice cream or frozen yogurt  1 tablespoon jam, jelly, pancake syrup, table sugar, or honey  2 tablespoons light pancake syrup  1 inch square of frosted cake or 2 inch square of unfrosted cake  2 small cookies (2/3 ounce each) or  large cookie  Sometimes you'll have to estimate carbohydrate amounts if you don't know the exact recipe. One cup of mixed foods like soups can have 1 to 2 carbohydrate servings, while some casseroles might have 2 or more servings of carbohydrate. Foods that have less than 20 calories in each serving can be counted as "free" foods. Count 1 cup raw vegetables, or  cup cooked non-starchy vegetables as "free" foods. If you eat 3 or more servings at one meal, then count them as 1 carbohydrate serving.  Foods without Carbohydrates  Not all foods contain carbohydrates. Meat, some dairy, fats, non-starchy vegetables, and many beverages don't contain carbohydrate. So when you count carbohydrates, you can generally exclude chicken, pork, beef, fish, seafood, eggs, tofu, cheese, butter, sour cream, avocado, nuts, seeds, olives, mayonnaise, water, black coffee, unsweetened tea, and zero-calorie drinks. Vegetables with no or low carbohydrate include green beans, cauliflower, tomatoes, and onions. How much carbohydrate should I eat at each meal?  Carbohydrate counting can help you plan your meals and manage your weight. Following are some starting points for carbohydrate intake at each meal. Work with your registered dietitian nutritionist to find the best range that  works for your blood glucose and weight.   To Lose Weight To Maintain Weight  Women 2 - 3 carb servings 3 - 4 carb servings  Men 3 - 4 carb servings 4 - 5 carb servings  Checking your blood glucose after meals will help you know if you need to adjust the timing, type, or number of carbohydrate servings in your meal plan. Achieve and keep a healthy body weight by balancing your food intake and physical activity.  Tips How should I plan my meals?  Plan for half the food on your plate to include non-starchy vegetables, like salad greens, broccoli, or carrots. Try to eat 3 to 5 servings of non-starchy vegetables every day. Have a protein food at each meal. Protein foods include chicken, fish, meat, eggs, or beans (note that beans contain carbohydrate). These two food groups (non-starchy vegetables and proteins) are low in carbohydrate. If you fill up your plate with these foods, you will eat less carbohydrate but still fill up your stomach. Try to limit your carbohydrate portion to  of the plate.  What fats are healthiest to eat?  Diabetes increases risk for heart disease. To help protect your heart, eat more healthy fats, such as olive oil, nuts, and avocado. Eat less saturated fats like butter, cream, and high-fat meats, like bacon and sausage. Avoid trans fats, which are in all foods that  list "partially hydrogenated oil" as an ingredient. What should I drink?  Choose drinks that are not sweetened with sugar. The healthiest choices are water, carbonated or seltzer waters, and tea and coffee without added sugars.  Sweet drinks will make your blood glucose go up very quickly. One serving of soda or energy drink is  cup. It is best to drink these beverages only if your blood glucose is low.  Artificially sweetened, or diet drinks, typically do not increase your blood glucose if they have zero calories in them. Read labels of beverages, as some diet drinks do have carbohydrate and will raise your blood  glucose. Label Reading Tips Read Nutrition Facts labels to find out how many grams of carbohydrate are in a food you want to eat. Don't forget: sometimes serving sizes on the label aren't the same as how much food you are going to eat, so you may need to calculate how much carbohydrate is in the food you are serving yourself.   Carbohydrate Counting for People with Diabetes Sample 1-Day Menu  Breakfast  cup yogurt, low fat, low sugar (1 carbohydrate serving)   cup cereal, ready-to-eat, unsweetened (1 carbohydrate serving)  1 cup strawberries (1 carbohydrate serving)   cup almonds ( carbohydrate serving)  Lunch 1, 5 ounce can chunk light tuna  2 ounces cheese, low fat cheddar  6 whole wheat crackers (1 carbohydrate serving)  1 small apple (1 carbohydrate servings)   cup carrots ( carbohydrate serving)   cup snap peas  1 cup 1% milk (1 carbohydrate serving)   Evening Meal Stir fry made with: 3 ounces chicken  1 cup brown rice (3 carbohydrate servings)   cup broccoli ( carbohydrate serving)   cup green beans   cup onions  1 tablespoon olive oil  2 tablespoons teriyaki sauce ( carbohydrate serving)  Evening Snack 1 extra small banana (1 carbohydrate serving)  1 tablespoon peanut butter   Carbohydrate Counting for People with Diabetes Vegan Sample 1-Day Menu  Breakfast 1 cup cooked oatmeal (2 carbohydrate servings)   cup blueberries (1 carbohydrate serving)  2 tablespoons flaxseeds  1 cup soymilk fortified with calcium and vitamin D  1 cup coffee  Lunch 2 slices whole wheat bread (2 carbohydrate servings)   cup baked tofu   cup lettuce  2 slices tomato  2 slices avocado   cup baby carrots ( carbohydrate serving)  1 orange (1 carbohydrate serving)  1 cup soymilk fortified with calcium and vitamin D   Evening Meal Burrito made with: 1 6-inch corn tortilla (1 carbohydrate serving)  1 cup refried vegetarian beans (2 carbohydrate servings)   cup chopped tomatoes    cup lettuce   cup salsa  1/3 cup brown rice (1 carbohydrate serving)  1 tablespoon olive oil for rice   cup zucchini   Evening Snack 6 small whole grain crackers (1 carbohydrate serving)  2 apricots ( carbohydrate serving)   cup unsalted peanuts ( carbohydrate serving)    Carbohydrate Counting for People with Diabetes Vegetarian (Lacto-Ovo) Sample 1-Day Menu  Breakfast 1 cup cooked oatmeal (2 carbohydrate servings)   cup blueberries (1 carbohydrate serving)  2 tablespoons flaxseeds  1 egg  1 cup 1% milk (1 carbohydrate serving)  1 cup coffee  Lunch 2 slices whole wheat bread (2 carbohydrate servings)  2 ounces low-fat cheese   cup lettuce  2 slices tomato  2 slices avocado   cup baby carrots ( carbohydrate serving)  1 orange (1 carbohydrate serving)  1 cup unsweetened tea  Evening Meal Burrito made with: 1 6-inch corn tortilla (1 carbohydrate serving)   cup refried vegetarian beans (1 carbohydrate serving)   cup tomatoes   cup lettuce   cup salsa  1/3 cup brown rice (1 carbohydrate serving)  1 tablespoon olive oil for rice   cup zucchini  1 cup 1% milk (1 carbohydrate serving)  Evening Snack 6 small whole grain crackers (1 carbohydrate serving)  2 apricots ( carbohydrate serving)   cup unsalted peanuts ( carbohydrate serving)    Copyright 2020  Academy of Nutrition and Dietetics. All rights reserved.  Using Nutrition Labels: Carbohydrate  Serving Size  Look at the serving size. All the information on the label is based on this portion. Servings Per Container  The number of servings contained in the package. Guidelines for Carbohydrate  Look at the total grams of carbohydrate in the serving size.  1 carbohydrate choice = 15 grams of carbohydrate. Range of Carbohydrate Grams Per Choice  Carbohydrate Grams/Choice Carbohydrate Choices  6-10   11-20 1  21-25 1  26-35 2  36-40 2  41-50 3  51-55 3  56-65 4  66-70 4  71-80 5    Copyright  2020  Academy of Nutrition and Dietetics. All rights reserved.

## 2023-08-25 NOTE — TOC CM/SW Note (Signed)
..  Transition of Care [facility]) - Inpatient Brief Assessment   Patient Details  Name: Shannon Santos MRN: [id number] Date of Birth: [birth year]  [facility]) CM/SW Contact:    Edsel DELENA Fischer, LCSW Phone Number: 08/25/2023, 2:56 PM   Clinical Narrative:  SW added list of providers to discharge summary  Transition of Care Asessment:

## 2023-08-26 ENCOUNTER — Other Ambulatory Visit: Payer: Self-pay

## 2023-08-26 LAB — GLUCOSE, CAPILLARY: Glucose-Capillary: 248 mg/dL — ABNORMAL HIGH (ref 70–99)

## 2023-08-26 MED ORDER — INSULIN PEN NEEDLE 32G X 6 MM MISC
0 refills | Status: DC
  Filled 2023-08-26: qty 100, 25d supply, fill #0

## 2023-08-26 MED ORDER — BASAGLAR KWIKPEN 100 UNIT/ML ~~LOC~~ SOPN
40.0000 [IU] | PEN_INJECTOR | Freq: Every day | SUBCUTANEOUS | 11 refills | Status: DC
Start: 2023-08-27 — End: 2023-10-10
  Filled 2023-08-26: qty 15, 37d supply, fill #0

## 2023-08-26 MED ORDER — LANCET DEVICE MISC
1.0000 | Freq: Three times a day (TID) | 0 refills | Status: AC
  Filled 2023-08-26: qty 1, 30d supply, fill #0

## 2023-08-26 MED ORDER — BLOOD GLUCOSE MONITOR SYSTEM W/DEVICE KIT
1.0000 | PACK | Freq: Three times a day (TID) | 0 refills | Status: AC
Start: 2023-08-26 — End: ?
  Filled 2023-08-26: qty 1, 30d supply, fill #0

## 2023-08-26 MED ORDER — INSULIN STARTER KIT- PEN NEEDLES (ENGLISH)
1.0000 | Freq: Once | Status: AC
  Administered 2023-08-26: 1
  Filled 2023-08-26: qty 1

## 2023-08-26 MED ORDER — INSULIN LISPRO (1 UNIT DIAL) 100 UNIT/ML (KWIKPEN)
10.0000 [IU] | PEN_INJECTOR | Freq: Three times a day (TID) | SUBCUTANEOUS | 11 refills | Status: DC
  Filled 2023-08-26: qty 15, 50d supply, fill #0

## 2023-08-26 MED ORDER — BLOOD GLUCOSE TEST VI STRP
1.0000 | ORAL_STRIP | Freq: Three times a day (TID) | 0 refills | Status: DC
Start: 2023-08-26 — End: 2023-09-10
  Filled 2023-08-26: qty 100, 34d supply, fill #0

## 2023-08-26 MED ORDER — LIVING WELL WITH DIABETES BOOK
Freq: Once | Status: AC
  Filled 2023-08-26: qty 1

## 2023-08-26 MED ORDER — TRUEPLUS LANCETS 33G MISC
1.0000 | Freq: Three times a day (TID) | 0 refills | Status: DC
  Filled 2023-08-26: qty 100, 30d supply, fill #0

## 2023-08-26 NOTE — Inpatient Diabetes Management (Signed)
 Inpatient Diabetes Program Recommendations  AACE/ADA: New Consensus Statement on Inpatient Glycemic Control (2015)  Target Ranges:  Prepandial:   less than 140 mg/dL      Peak postprandial:   less than 180 mg/dL (1-2 hours)      Critically ill patients:  140 - 180 mg/dL   Lab Results  Component Value Date   GLUCAP 257 (H) 08/25/2023   HGBA1C 12.0 (H) 08/24/2023    Latest Reference Range & Units 08/25/23 11:22 08/25/23 16:36 08/25/23 20:33 08/26/23 09:43  Glucose-Capillary 70 - 99 mg/dL 655 (H) 716 (H) 742 (H) 248 (H)  (H): Data is abnormally high  Diabetes history: New Onset DM2 Current orders for Inpatient glycemic control: Semglee  40 units daily, Novolog  0-20 units qid  Inpatient Diabetes Program Recommendations:    Spoke with pt and boyfriend (with permission from pt.) about new diagnosis. Discussed A1C results with them and explained what an A1C is, basic pathophysiology of DM Type 2, basic home care, basic diabetes diet nutrition principles, importance of checking CBGs and maintaining good CBG control to prevent long-term and short-term complications. Reviewed signs and symptoms of hyperglycemia and hypoglycemia and how to treat hypoglycemia at home. Also reviewed blood sugar goals at home.  RNs to provide ongoing basic DM education at bedside with this patient. Have ordered educational booklet, insulin  starter kit, and RD consult for DM diet education for this patient.   Educated patient and boyfriend on insulin  pen use at home. Reviewed contents of insulin  flexpen starter kit. Reviewed all steps if insulin  pen including attachment of needle, 2-unit air shot, dialing up dose, giving injection, removing needle, disposal of sharps, storage of unused insulin , disposal of insulin  etc. Patient able to provide successful return demonstration. Also reviewed troubleshooting with insulin  pen. MD to give patient Rxs for insulin  pens and insulin  pen needles.   MD ordered application of  Freestyle CGM at discharge for patient. Education done regarding application and changing CGM sensor (alternate every 14 days on back of arms), 1 hour warm-up, use of glucometer when alert displays, how to scan CGM for glucose reading and information for PCP. Patient has also been given educational packet regarding use CGM sensor including the 1-800 toll free number for any questions, problems or needs related to the [facility] sensors or reader.    Sensor applied by patient to (R) Arm at 9:15 .  Explained that glucose readings will not be available until 1 hour after application. Reviewed use of CGM including how to scan, changing Sensor, Vitamin C warning, arrows with glucose readings, and Freestyle app. Patient very appreciative.   Thank you, Shannon Santos E. Kyston Gonce, RN, MSN, CDCES  Diabetes Coordinator Inpatient Glycemic Control Team Team Pager [phone] (8am-5pm) 08/26/2023 9:55 AM

## 2023-08-26 NOTE — TOC CM/SW Note (Signed)
 Transition of Care [facility]) - Inpatient Brief Assessment   Patient Details  Name: Shannon Santos MRN: [id number] Date of Birth: Jun 14, 2000  [facility]) CM/SW Contact:    Alvaro Louder, LCSW Phone Number: 08/26/2023, 10:45 AM   Clinical Narrative:  Chart Reviewed, LCSWA to place List of PCP's in AVS for patient to review. No TOC needs at this time.    Transition of Care Asessment: Insurance and Status: Insurance coverage has been reviewed Patient has primary care physician: No Home environment has been reviewed: Single Family home Prior level of function:: x4 Oriented Prior/Current Home Services: No current home services Social Drivers of Health Review: SDOH reviewed needs interventions Readmission risk has been reviewed: Yes Transition of care needs: transition of care needs identified, TOC will continue to follow

## 2023-08-26 NOTE — Discharge Summary (Addendum)
 Physician Discharge Summary   Patient: Shannon Santos MRN: [id number] DOB: October 17, 2000  Admit date:     08/24/2023  Discharge date: 08/26/23  Discharge Physician: Kahmari Koller   PCP: [facility]   Recommendations at discharge:   Inject insulin  as recommended Maintain a consistent carbohydrate diet Check blood sugars and keep a log and take to your primary care provider  Discharge Diagnoses: Principal Problem:   Hyperglycemia Active Problems:   Obesity, Class III, BMI 40-49.9 (morbid obesity)   Diabetes mellitus, new onset (HCC)   High anion gap metabolic acidosis  Resolved Problems:   * No resolved hospital problems. [facility] Course:  Shannon Santos is a 23 year old female with morbid obesity, who presents ED for chief concern of nausea, shortness of breath, weakness.   Vitals in the ED showed T 97.5, respiration rate 20, heart rate 125, blood pressure 129/114, SpO2 97% on room air.   Serum sodium is 135, potassium 4.2, chloride 100, bicarb 19, BUN of 13, serum creatinine 0.82, EGFR greater than 60, nonfasting blood glucose 556.   Anion gap was elevated at 16. Patient had initial lactic acid of 2.3 and improved to 1.8.  HS troponin was 5 and on repeat is 4.   AST is 65, ALT is 64.  Beta-hydroxybutyrate was mildly elevated at 0.98.  UA was negative for nitrates and leukocytes.  Positive for rare bacteria.   Urine pregnancy test was negative.   ED treatment: Insulin  aspart 10 units one-time dose, sodium chloride  1 L bolus, lactated ringer  1 L bolus. --------------------------------------- At bedside, patient able to tell me her name, age, location, current calendar year.    She reports on Thursday, she developed increased urination and increased thirst. She denies chest pain, dysuria, hematuria, blood in her stool, diarrhea. She denies trauma to her person.      Assessment and Plan:  Diabetic ketoacidosis New onset diabetes  mellitus Hyperglycemia Patient was initially placed on insulin  drip for new onset diabetes mellitus with complications of DKA and has been weaned off and is currently on long-acting insulin  as well as Premeal insulin  Hemoglobin A1c is elevated at 12 Appreciate diabetic educator input Will discharge patient on Basaglar  40 units daily and NovoLog  10 units with meals        Morbid obesity (BMI 42.31) Obesity, class III Complicates overall prognosis and care Lifestyle modification and exercise has been discussed with patient in detail. * Hyperglycemia Continue insulin  via Endo tool Continue with aggressive IV hydration Check BMP every 4 hours Check A1c Diabetes coordinator/educator consulted  High anion gap metabolic acidosis Suspect secondary to hyper glycemia Continue treatment per above Recheck BMP every 4 hours  Diabetes mellitus, new onset (HCC) Check A1c  Obesity, Class III, BMI 40-49.9 (morbid obesity) This complicates overall care and prognosis.  Counsel patient on weight loss         Consultants: None Procedures performed: None  Disposition: Home Diet recommendation:  Discharge Diet Orders (From admission, onward)     Start     Ordered   08/26/23 0000  Diet - low sodium heart healthy        08/26/23 1027   08/26/23 0000  Diet Carb Modified        08/26/23 1027           Carb modified diet DISCHARGE MEDICATION: Allergies as of 08/26/2023   No Known Allergies      Medication List     TAKE these medications  Blood Glucose Monitoring Suppl Devi 1 each by Does not apply route in the morning, at noon, and at bedtime. May substitute to any manufacturer covered by patient's insurance.   BLOOD GLUCOSE TEST STRIPS Strp 1 each by In Vitro route in the morning, at noon, and at bedtime. May substitute to any manufacturer covered by patient's insurance.   insulin  aspart 100 UNIT/ML FlexPen Commonly known as: NOVOLOG  Inject 10 Units into the skin 3  (three) times daily with meals.   insulin  glargine-yfgn 100 UNIT/ML injection Commonly known as: SEMGLEE  Inject 0.4 mLs (40 Units total) into the skin daily. Start taking on: August 27, 2023   Lancet Device Misc 1 each by Does not apply route in the morning, at noon, and at bedtime. May substitute to any manufacturer covered by patient's insurance.   Lancets Misc. Misc 1 each by Does not apply route in the morning, at noon, and at bedtime. May substitute to any manufacturer covered by patient's insurance.        Discharge Exam: Filed Weights   08/24/23 1318 08/24/23 1726  Weight: 117.9 kg 111.8 kg   Constitutional: appears stated age, morbidly obese Eyes: PERRL, lids and conjunctivae normal ENMT: Mucous membranes are moist. Posterior pharynx clear of any exudate or lesions. Age-appropriate dentition. Hearing appropriate Neck: normal, supple, no masses, no thyromegaly Respiratory: clear to auscultation bilaterally, no wheezing, no crackles. Normal respiratory effort. No accessory muscle use.  Cardiovascular: Regular rate and rhythm, no murmurs / rubs / gallops. No extremity edema. 2+ pedal pulses. No carotid bruits.  Abdomen: obese abdomen, no tenderness, no masses palpated, no hepatosplenomegaly. Bowel sounds positive.  Musculoskeletal: no clubbing / cyanosis. No joint deformity upper and lower extremities. Good ROM, no contractures, no atrophy. Normal muscle tone.  Skin: no rashes, lesions, ulcers. No induration Neurologic: Sensation intact. Strength 5/5 in all 4.  Psychiatric: Normal judgment and insight. Alert and oriented x 3. Normal mood.     Condition at discharge: stable  The results of significant diagnostics from this hospitalization (including imaging, microbiology, ancillary and laboratory) are listed below for reference.   Imaging Studies: DG Chest 2 View Result Date: 08/24/2023 CLINICAL DATA:  Shortness of breath and tachycardia.  Chest pain. EXAM: CHEST - 2 VIEW  COMPARISON:  Chest radiograph dated 11/20/2020. FINDINGS: The heart size and mediastinal contours are within normal limits. Both lungs are clear. The visualized skeletal structures are unremarkable. IMPRESSION: No active cardiopulmonary disease. Electronically Signed   By: Vanetta Chou M.D.   On: 08/24/2023 13:38    Microbiology: Results for orders placed or performed during the hospital encounter of 08/24/23  MRSA Next Gen by PCR, Nasal     Status: None   Collection Time: 08/24/23  5:23 PM   Specimen: Nasal Mucosa; Nasal Swab  Result Value Ref Range Status   MRSA by PCR Next Gen NOT DETECTED NOT DETECTED Final    Comment: (NOTE) The GeneXpert MRSA Assay (FDA approved for NASAL specimens only), is one component of a comprehensive MRSA colonization surveillance program. It is not intended to diagnose MRSA infection nor to guide or monitor treatment for MRSA infections. Test performance is not FDA approved in patients less than 38 years old. Performed at [facility], [address] [place], [place] [address]     Labs: CBC: Recent Labs  Lab 08/24/23 1321  WBC 5.5  HGB 14.7  HCT 44.1  MCV 83.8  PLT 285   Basic Metabolic Panel: Recent Labs  Lab 08/24/23 1321  08/24/23 1940 08/24/23 2330 08/25/23 0355 08/25/23 0750  NA 135 137 137 137 138  K 4.2 3.5 3.7 3.7 3.9  CL 100 103 104 106 103  CO2 19* 22 24 23 23   GLUCOSE 556* 273* 237* 198* 163*  BUN 13 10 10 10 10   CREATININE 0.82 0.65 0.55 0.65 0.67  CALCIUM 9.8 9.3 8.9 9.0 8.9  MG  --   --   --   --  2.0  PHOS  --   --   --   --  3.4   Liver Function Tests: Recent Labs  Lab 08/24/23 1406  AST 65*  ALT 64*  ALKPHOS 103  BILITOT 0.9  PROT 8.4*  ALBUMIN 4.6   CBG: Recent Labs  Lab 08/25/23 0844 08/25/23 1122 08/25/23 1636 08/25/23 2033 08/26/23 0943  GLUCAP 180* 344* 283* 257* 248*    Discharge time spent: greater than 30 minutes.  Signed: Aimee Somerset, MD [facility] 08/26/2023

## 2023-08-26 NOTE — Plan of Care (Signed)
   Problem: Education: Goal: Ability to describe self-care measures that may prevent or decrease complications (Diabetes Survival Skills Education) will improve Outcome: Progressing

## 2023-08-27 ENCOUNTER — Other Ambulatory Visit: Payer: Self-pay

## 2023-08-28 ENCOUNTER — Other Ambulatory Visit (HOSPITAL_COMMUNITY): Payer: Self-pay

## 2023-09-01 ENCOUNTER — Encounter: Payer: Self-pay | Admitting: Family Medicine

## 2023-09-02 ENCOUNTER — Other Ambulatory Visit: Payer: Self-pay

## 2023-09-10 ENCOUNTER — Ambulatory Visit (INDEPENDENT_AMBULATORY_CARE_PROVIDER_SITE_OTHER): Admitting: Internal Medicine

## 2023-09-10 ENCOUNTER — Encounter: Payer: Self-pay | Admitting: Internal Medicine

## 2023-09-10 VITALS — BP 122/74 | HR 100 | Temp 98.5°F | Resp 18 | Ht 64.0 in | Wt 242.7 lb

## 2023-09-10 DIAGNOSIS — E1165 Type 2 diabetes mellitus with hyperglycemia: Secondary | ICD-10-CM

## 2023-09-10 DIAGNOSIS — E119 Type 2 diabetes mellitus without complications: Secondary | ICD-10-CM | POA: Diagnosis not present

## 2023-09-10 DIAGNOSIS — H52223 Regular astigmatism, bilateral: Secondary | ICD-10-CM | POA: Diagnosis not present

## 2023-09-10 DIAGNOSIS — E049 Nontoxic goiter, unspecified: Secondary | ICD-10-CM | POA: Diagnosis not present

## 2023-09-10 DIAGNOSIS — Z114 Encounter for screening for human immunodeficiency virus [HIV]: Secondary | ICD-10-CM | POA: Diagnosis not present

## 2023-09-10 DIAGNOSIS — Z1159 Encounter for screening for other viral diseases: Secondary | ICD-10-CM | POA: Diagnosis not present

## 2023-09-10 LAB — GLUCOSE, POCT (MANUAL RESULT ENTRY): POC Glucose: 106 mg/dL — AB (ref 70–99)

## 2023-09-10 MED ORDER — TRUEPLUS LANCETS 33G MISC: 1.0000 | Freq: Three times a day (TID) | 0 refills | Status: AC

## 2023-09-10 MED ORDER — BLOOD GLUCOSE TEST VI STRP
1.0000 | ORAL_STRIP | Freq: Three times a day (TID) | 0 refills | Status: DC
Start: 2023-09-10 — End: 2023-09-13

## 2023-09-10 NOTE — Progress Notes (Signed)
 New Patient Office Visit  Subjective    Patient ID: Shannon Santos, female    DOB: Mar 12, 2000  Age: 23 y.o. MRN: [id number]  CC:  Chief Complaint  Patient presents with   Establish Care   Diabetes    HPI Shannon Santos presents to establish care. Prior to recent hospitalization in July, she's been diagnosed with no chronic medical conditions and does not take regular medications.   Discussed the use of AI scribe software for clinical note transcription with the patient, who gave verbal consent to proceed.  History of Present Illness Shannon Santos is a 23 year old female with diabetes who presents with concerns about blood sugar management.  She was recently diagnosed with diabetes after an episode of weakness and near-syncope that required hospitalization. She feels overwhelmed by the diagnosis and the insulin  regimen, which involves multiple daily doses. She monitors her blood glucose levels, noting fluctuations but recent stabilization. Her fasting blood sugars are typically 109 to 114 mg/dL. She did not take her insulin  before today's appointment.  She has adjusted her diet by increasing protein and reducing carbohydrates, which she believes has helped stabilize her blood sugar. Her diet includes [place] yogurt with zero sugar and blueberries, and she has been cooking at home since her hospital discharge.  She has a family history of diabetes, with her grandmother and uncle also affected. Her grandmother uses oral medication, while her uncle uses insulin . She is concerned about the implications of her diagnosis and its impact on her life, including feelings of anxiety.  She experiences tingling in her hands, which she attributes to changes in blood sugar levels. She also has headaches and uses over-the-counter pain relievers like Advil  or Aleve for relief.   Diabetes -Last A1c 7/25 12.0% - newly diagnosed while inpatient -Medications: Basaglar  40 units daily in the morning, Humalog   10 units TID -Patient is compliant with the above medications and reports no side effects.  -Checking BG at home: 109-120 fasting -Diet: working hard on decreasing carbs and eating healthy -Eye exam: Due, has an optometry appointment next week -Foot exam: Due, discuss at follow up -Microalbumin: Due today -Statin: No -PNA vaccine: Discuss at follow up   Outpatient Encounter Medications as of 09/10/2023  Medication Sig   Insulin  Glargine (BASAGLAR  KWIKPEN) 100 UNIT/ML Inject 40 Units into the skin daily.   insulin  lispro (HUMALOG ) 100 UNIT/ML KwikPen Inject 10 Units into the skin 3 (three) times daily with meals.   Blood Glucose Monitoring Suppl (BLOOD GLUCOSE MONITOR SYSTEM) w/Device KIT 1 each by Does not apply route in the morning, at noon, and at bedtime. May substitute to any manufacturer covered by patient's insurance.   Glucose Blood (BLOOD GLUCOSE TEST STRIPS) STRP 1 each by In Vitro route in the morning, at noon, and at bedtime. May substitute to any manufacturer covered by patient's insurance.   Insulin  Pen Needle 32G X 6 MM MISC as directed.   Lancet Device MISC 1 each by Does not apply route in the morning, at noon, and at bedtime. May substitute to any manufacturer covered by patient's insurance.   TRUEplus Lancets 33G MISC 1 each by Does not apply route in the morning, at noon, and at bedtime. May substitute to any manufacturer covered by patient's insurance.   No facility-administered encounter medications on file as of 09/10/2023.    Past Medical History:  Diagnosis Date   Diabetes mellitus without complication (HCC)    Morbid obesity (HCC)     No past  surgical history on file.  Family History  Problem Relation Age of Onset   Diabetes Maternal Grandmother     Social History   Socioeconomic History   Marital status: Single    Spouse name: Not on file   Number of children: Not on file   Years of education: Not on file   Highest education level: Bachelor's degree  (e.g., BA, AB, BS)  Occupational History   Not on file  Tobacco Use   Smoking status: Some Days    Types: Cigarettes, Cigars   Smokeless tobacco: Never  Vaping Use   Vaping status: Never Used  Substance and Sexual Activity   Alcohol use: Never   Drug use: Never   Sexual activity: Yes    Partners: Male  Other Topics Concern   Not on file  Social History Narrative   Not on file   Social Drivers of Health   Financial Resource Strain: Low Risk  (09/09/2023)   Overall Financial Resource Strain (CARDIA)    Difficulty of Paying Living Expenses: Not very hard  Food Insecurity: No Food Insecurity (09/09/2023)   Hunger Vital Sign    Worried About Running Out of Food in the Last Year: Never true    Ran Out of Food in the Last Year: Never true  Transportation Needs: No Transportation Needs (09/09/2023)   PRAPARE - Administrator, Civil Service (Medical): No    Lack of Transportation (Non-Medical): No  Physical Activity: Sufficiently Active (09/09/2023)   Exercise Vital Sign    Days of Exercise per Week: 3 days    Minutes of Exercise per Session: 70 min  Stress: No Stress Concern Present (09/09/2023)   [employer] of Occupational Health - Occupational Stress Questionnaire    Feeling of Stress: Not at all  Social Connections: Moderately Integrated (09/09/2023)   Social Connection and Isolation Panel    Frequency of Communication with Friends and Family: More than three times a week    Frequency of Social Gatherings with Friends and Family: Twice a week    Attends Religious Services: 1 to 4 times per year    Active Member of [employer] or Organizations: No    Attends Banker Meetings: Not on file    Marital Status: Living with partner  Intimate Partner Violence: Not At Risk (08/24/2023)   Humiliation, Afraid, Rape, and Kick questionnaire    Fear of Current or Ex-Partner: No    Emotionally Abused: No    Physically Abused: No    Sexually Abused: No    Review of  Systems  All other systems reviewed and are negative.       Objective    Pulse 100   Temp 98.5 F (36.9 C) (Oral)   Resp 18   Ht 5' 4 (1.626 m)   Wt 242 lb 11.2 oz (110.1 kg)   LMP 08/23/2023 (Approximate)   SpO2 99%   BMI 41.66 kg/m   Physical Exam Constitutional:      Appearance: Normal appearance.  HENT:     Head: Normocephalic and atraumatic.     Mouth/Throat:     Mouth: Mucous membranes are moist.     Pharynx: Oropharynx is clear.  Eyes:     Extraocular Movements: Extraocular movements intact.     Conjunctiva/sclera: Conjunctivae normal.     Pupils: Pupils are equal, round, and reactive to light.  Neck:     Comments: Symmetric thyromegaly on exam, no nodules palpated  Cardiovascular:  Rate and Rhythm: Normal rate and regular rhythm.  Pulmonary:     Effort: Pulmonary effort is normal.     Breath sounds: Normal breath sounds.  Musculoskeletal:     Cervical back: No tenderness.  Lymphadenopathy:     Cervical: No cervical adenopathy.  Skin:    General: Skin is warm and dry.  Neurological:     General: No focal deficit present.     Mental Status: She is alert. Mental status is at baseline.  Psychiatric:        Mood and Affect: Mood normal.        Behavior: Behavior normal.         Assessment & Plan:   Assessment & Plan Diabetes mellitus, type undetermined, uncontrolled Newly diagnosed diabetes with A1c of 12.0, likely type 2 based on age and family history. Blood sugars stabilizing with dietary changes. - Order autoimmune markers to determine diabetes type. - Continue Basaglar  at 35 units in the morning. - Hold Humalog  unless postprandial blood sugar is 200 or higher, then administer 5 units and recheck. - Encourage dietary changes focusing on increased protein and decreased carbohydrates. - Refer to a nutritionist for personalized dietary recommendations. - Perform urine test to check for kidney function. - Educated on signs of hypoglycemia and  appropriate interventions. - Schedule follow-up appointment in 2 weeks to review lab results and adjust treatment plan.  Thyroid gland enlargement Thyroid gland slightly enlarged. - Include thyroid function tests in the lab workup.  Headache, Episodic  Intermittent headaches, possibly anxiety-related. - Recommend Advil  or Aleve for headache relief as needed, ensuring to take with food.   - CBC w/Diff/Platelet - Comprehensive Metabolic Panel (CMET) - C-peptide - Insulin , Free (Bioactive) - GAD65 Neurological Syndrome Antibody Test - Urine Microalbumin w/creat. ratio - Referral to Nutrition and Diabetes Services - TRUEplus Lancets 33G MISC; 1 each by Does not apply route in the morning, at noon, and at bedtime. May substitute to any manufacturer covered by patient's insurance.  Dispense: 100 each; Refill: 0 - Glucose Blood (BLOOD GLUCOSE TEST STRIPS) STRP; 1 each by In Vitro route in the morning, at noon, and at bedtime. May substitute to any manufacturer covered by patient's insurance.  Dispense: 100 each; Refill: 0 - POCT Glucose (CBG) - TSH - Hepatitis C Antibody - HIV antibody (with reflex)   Return in about 2 weeks (around 09/24/2023).   Sharyle Fischer, DO

## 2023-09-10 NOTE — Patient Instructions (Addendum)
 It was great seeing you today!  Plan discussed at today's visit: -Blood work ordered today, results will be uploaded to MyChart.  -Stop Humalog , reduce Basaglar  35 units -Continue to check fasting and non-fasting (1-2 hours after largest meal) -Continue to work on nutrition, info below and referral to Nutritionist placed   Follow up in: 2 weeks  Take care and let us  know if you have any questions or concerns prior to your next visit.  Dr. Bernardo   Diabetes Mellitus and Nutrition, Adult When you have diabetes, or diabetes mellitus, it is very important to have healthy eating habits because your blood sugar (glucose) levels are greatly affected by what you eat and drink. Eating healthy foods in the right amounts, at about the same times every day, can help you: Manage your blood glucose. Lower your risk of heart disease. Improve your blood pressure. Reach or maintain a healthy weight. What can affect my meal plan? Every person with diabetes is different, and each person has different needs for a meal plan. Your health care provider may recommend that you work with a dietitian to make a meal plan that is best for you. Your meal plan may vary depending on factors such as: The calories you need. The medicines you take. Your weight. Your blood glucose, blood pressure, and cholesterol levels. Your activity level. Other health conditions you have, such as heart or kidney disease. How do carbohydrates affect me? Carbohydrates, also called carbs, affect your blood glucose level more than any other type of food. Eating carbs raises the amount of glucose in your blood. It is important to know how many carbs you can safely have in each meal. This is different for every person. Your dietitian can help you calculate how many carbs you should have at each meal and for each snack. How does alcohol affect me? Alcohol can cause a decrease in blood glucose (hypoglycemia), especially if you use insulin   or take certain diabetes medicines by mouth. Hypoglycemia can be a life-threatening condition. Symptoms of hypoglycemia, such as sleepiness, dizziness, and confusion, are similar to symptoms of having too much alcohol. Do not drink alcohol if: Your health care provider tells you not to drink. You are pregnant, may be pregnant, or are planning to become pregnant. If you drink alcohol: Limit how much you have to: 0-1 drink a day for women. 0-2 drinks a day for men. Know how much alcohol is in your drink. In the [place] one drink equals one 12 oz bottle of beer (355 mL), one 5 oz glass of wine (148 mL), or one 1 oz glass of hard liquor (44 mL). Keep yourself hydrated with water, diet soda, or unsweetened iced tea. Keep in mind that regular soda, juice, and other mixers may contain a lot of sugar and must be counted as carbs. What are tips for following this plan?  Reading food labels Start by checking the serving size on the Nutrition Facts label of packaged foods and drinks. The number of calories and the amount of carbs, fats, and other nutrients listed on the label are based on one serving of the item. Many items contain more than one serving per package. Check the total grams (g) of carbs in one serving. Check the number of grams of saturated fats and trans fats in one serving. Choose foods that have a low amount or none of these fats. Check the number of milligrams (mg) of salt (sodium) in one serving. Most people should limit total sodium  intake to less than 2,300 mg per day. Always check the nutrition information of foods labeled as low-fat or nonfat. These foods may be higher in added sugar or refined carbs and should be avoided. Talk to your dietitian to identify your daily goals for nutrients listed on the label. Shopping Avoid buying canned, pre-made, or processed foods. These foods tend to be high in fat, sodium, and added sugar. Shop around the outside edge of the grocery store.  This is where you will most often find fresh fruits and vegetables, bulk grains, fresh meats, and fresh dairy products. Cooking Use low-heat cooking methods, such as baking, instead of high-heat cooking methods, such as deep frying. Cook using healthy oils, such as olive, canola, or sunflower oil. Avoid cooking with butter, cream, or high-fat meats. Meal planning Eat meals and snacks regularly, preferably at the same times every day. Avoid going long periods of time without eating. Eat foods that are high in fiber, such as fresh fruits, vegetables, beans, and whole grains. Eat 4-6 oz (112-168 g) of lean protein each day, such as lean meat, chicken, fish, eggs, or tofu. One ounce (oz) (28 g) of lean protein is equal to: 1 oz (28 g) of meat, chicken, or fish. 1 egg.  cup (62 g) of tofu. Eat some foods each day that contain healthy fats, such as avocado, nuts, seeds, and fish. What foods should I eat? Fruits Berries. Apples. Oranges. Peaches. Apricots. Plums. Grapes. Mangoes. Papayas. Pomegranates. Kiwi. Cherries. Vegetables Leafy greens, including lettuce, spinach, kale, chard, collard greens, mustard greens, and cabbage. Beets. Cauliflower. Broccoli. Carrots. Green beans. Tomatoes. Peppers. Onions. Cucumbers. Brussels sprouts. Grains Whole grains, such as whole-wheat or whole-grain bread, crackers, tortillas, cereal, and pasta. Unsweetened oatmeal. Quinoa. Brown or wild rice. Meats and other proteins Seafood. Poultry without skin. Lean cuts of poultry and beef. Tofu. Nuts. Seeds. Dairy Low-fat or fat-free dairy products such as milk, yogurt, and cheese. The items listed above may not be a complete list of foods and beverages you can eat and drink. Contact a dietitian for more information. What foods should I avoid? Fruits Fruits canned with syrup. Vegetables Canned vegetables. Frozen vegetables with butter or cream sauce. Grains Refined white flour and flour products such as bread,  pasta, snack foods, and cereals. Avoid all processed foods. Meats and other proteins Fatty cuts of meat. Poultry with skin. Breaded or fried meats. Processed meat. Avoid saturated fats. Dairy Full-fat yogurt, cheese, or milk. Beverages Sweetened drinks, such as soda or iced tea. The items listed above may not be a complete list of foods and beverages you should avoid. Contact a dietitian for more information. Questions to ask a health care provider Do I need to meet with a certified diabetes care and education specialist? Do I need to meet with a dietitian? What number can I call if I have questions? When are the best times to check my blood glucose? Where to find more information: American Diabetes Association: diabetes.org Academy of Nutrition and Dietetics: eatright.[employer] of Diabetes and Digestive and Kidney Diseases: StageSync.si Association of Diabetes Care & Education Specialists: diabeteseducator.org Summary It is important to have healthy eating habits because your blood sugar (glucose) levels are greatly affected by what you eat and drink. It is important to use alcohol carefully. A healthy meal plan will help you manage your blood glucose and lower your risk of heart disease. Your health care provider may recommend that you work with a dietitian to make a meal plan  that is best for you. This information is not intended to replace advice given to you by your health care provider. Make sure you discuss any questions you have with your health care provider. Document Revised: 08/18/2019 Document Reviewed: 08/19/2019 Elsevier Patient Education  2024 [employer].

## 2023-09-11 ENCOUNTER — Telehealth: Payer: Self-pay | Admitting: Internal Medicine

## 2023-09-11 NOTE — Telephone Encounter (Signed)
 Accu Chek guide monitor system  Please send new script. Pt has old meter that does not work with the current test strips insurance covers

## 2023-09-12 NOTE — Telephone Encounter (Signed)
 Second request

## 2023-09-13 ENCOUNTER — Other Ambulatory Visit: Payer: Self-pay | Admitting: Emergency Medicine

## 2023-09-13 DIAGNOSIS — E1165 Type 2 diabetes mellitus with hyperglycemia: Secondary | ICD-10-CM

## 2023-09-13 MED ORDER — BLOOD GLUCOSE TEST VI STRP: 1.0000 | ORAL_STRIP | Freq: Three times a day (TID) | 0 refills | Status: DC

## 2023-09-13 NOTE — Telephone Encounter (Signed)
 Order sent for strips for True Metrux

## 2023-09-18 ENCOUNTER — Ambulatory Visit: Payer: Self-pay | Admitting: Internal Medicine

## 2023-09-19 ENCOUNTER — Telehealth (INDEPENDENT_AMBULATORY_CARE_PROVIDER_SITE_OTHER): Admitting: Internal Medicine

## 2023-09-19 ENCOUNTER — Ambulatory Visit

## 2023-09-19 ENCOUNTER — Other Ambulatory Visit (HOSPITAL_COMMUNITY): Payer: Self-pay

## 2023-09-19 ENCOUNTER — Other Ambulatory Visit: Payer: Self-pay | Admitting: Internal Medicine

## 2023-09-19 ENCOUNTER — Other Ambulatory Visit: Payer: Self-pay

## 2023-09-19 ENCOUNTER — Telehealth: Payer: Self-pay | Admitting: Pharmacy Technician

## 2023-09-19 DIAGNOSIS — Z7985 Long-term (current) use of injectable non-insulin antidiabetic drugs: Secondary | ICD-10-CM

## 2023-09-19 DIAGNOSIS — E66813 Obesity, class 3: Secondary | ICD-10-CM

## 2023-09-19 DIAGNOSIS — L600 Ingrowing nail: Secondary | ICD-10-CM

## 2023-09-19 DIAGNOSIS — E1165 Type 2 diabetes mellitus with hyperglycemia: Secondary | ICD-10-CM

## 2023-09-19 LAB — COMPREHENSIVE METABOLIC PANEL WITH GFR
AG Ratio: 1.8 (calc) (ref 1.0–2.5)
ALT: 45 U/L — ABNORMAL HIGH (ref 6–29)
AST: 25 U/L (ref 10–30)
Albumin: 4.3 g/dL (ref 3.6–5.1)
Alkaline phosphatase (APISO): 75 U/L (ref 31–125)
BUN: 11 mg/dL (ref 7–25)
CO2: 26 mmol/L (ref 20–32)
Calcium: 9.4 mg/dL (ref 8.6–10.2)
Chloride: 105 mmol/L (ref 98–110)
Creat: 0.68 mg/dL (ref 0.50–0.96)
Globulin: 2.4 g/dL (ref 1.9–3.7)
Glucose, Bld: 105 mg/dL — ABNORMAL HIGH (ref 65–99)
Potassium: 4.2 mmol/L (ref 3.5–5.3)
Sodium: 140 mmol/L (ref 135–146)
Total Bilirubin: 0.4 mg/dL (ref 0.2–1.2)
Total Protein: 6.7 g/dL (ref 6.1–8.1)
eGFR: 125 mL/min/1.73m2 (ref >=60)

## 2023-09-19 LAB — CBC WITH DIFFERENTIAL/PLATELET
Absolute Lymphocytes: 1658 {cells}/uL (ref 850–3900)
Absolute Monocytes: 400 {cells}/uL (ref 200–950)
Basophils Absolute: 22 {cells}/uL (ref 0–200)
Basophils Relative: 0.4 %
Eosinophils Absolute: 243 {cells}/uL (ref 15–500)
Eosinophils Relative: 4.5 %
HCT: 43.5 % (ref 35.0–45.0)
Hemoglobin: 14.1 g/dL (ref 11.7–15.5)
MCH: 27.8 pg (ref 27.0–33.0)
MCHC: 32.4 g/dL (ref 32.0–36.0)
MCV: 85.6 fL (ref 80.0–100.0)
MPV: 11.3 fL (ref 7.5–12.5)
Monocytes Relative: 7.4 %
Neutro Abs: 3078 {cells}/uL (ref 1500–7800)
Neutrophils Relative %: 57 %
Platelets: 323 Thousand/uL (ref 140–400)
RBC: 5.08 Million/uL (ref 3.80–5.10)
RDW: 13.6 % (ref 11.0–15.0)
Total Lymphocyte: 30.7 %
WBC: 5.4 Thousand/uL (ref 3.8–10.8)

## 2023-09-19 LAB — GAD65 NEUROLOGICAL SYNDROME ANTIBODY TEST

## 2023-09-19 LAB — INSULIN, FREE (BIOACTIVE): Insulin, Free: 34.5 u[IU]/mL — ABNORMAL HIGH (ref 1.5–14.9)

## 2023-09-19 LAB — MICROALBUMIN / CREATININE URINE RATIO
Creatinine, Urine: 193 mg/dL (ref 20–275)
Microalb Creat Ratio: 2 mg/g{creat} (ref <30)
Microalb, Ur: 0.4 mg/dL

## 2023-09-19 LAB — HEPATITIS C ANTIBODY: Hepatitis C Ab: NONREACTIVE

## 2023-09-19 LAB — TSH: TSH: 1.46 m[IU]/L

## 2023-09-19 LAB — C-PEPTIDE: C-Peptide: 3.32 ng/mL (ref 0.80–3.85)

## 2023-09-19 LAB — HIV ANTIBODY (ROUTINE TESTING W REFLEX): HIV 1&2 Ab, 4th Generation: NONREACTIVE

## 2023-09-19 MED ORDER — TIRZEPATIDE 2.5 MG/0.5ML ~~LOC~~ SOAJ: 2.5000 mg | SUBCUTANEOUS | 1 refills | Status: DC

## 2023-09-19 NOTE — Progress Notes (Signed)
 Virtual Visit via Video Note  I connected with Shannon Santos on 09/19/23 at  2:00 PM EDT by a video enabled telemedicine application and verified that I am speaking with the correct person using two identifiers.  Location: Patient: Home Provider: [facility]   I discussed the limitations of evaluation and management by telemedicine and the availability of in person appointments. The patient expressed understanding and agreed to proceed.  History of Present Illness:  Patient is presenting via telemedicine to discus foot and diabetes.   Discussed the use of AI scribe software for clinical note transcription with the patient, who gave verbal consent to proceed.  History of Present Illness Shannon Santos is a 23 year old female with type 2 diabetes who presents with an ingrown toenail and diabetes management.  She experiences pain and redness in her toe due to an ingrown toenail. The nail is curving and causing discomfort, especially when walking or putting weight on it.  She is on insulin  therapy, taking 35 units daily, with an as-needed prescription for Humalog , which she has not used. Blood sugar levels are stable, ranging from 90s to 134 mg/dL, with occasional readings in the 100s. She uses a TrueMetrix glucose meter and manages her diabetes with supplies from [facility]. There are issues with insurance coverage for her diabetes supplies.    Observations/Objective:  General: well appearing, no acute distress ENT: conjunctiva normal appearing bilaterally  Cardio: RRR, no murmurs, rubs or gallops auscultated Skin: no rashes, cyanosis or abnormal bruising noted. Right great toenail thick and curving/ingrown.    Assessment and Plan: Assessment & Plan Ingrown toenail without infection Pain and redness present without infection. - Refer to podiatry for evaluation and potential partial nail removal. - Advise to report signs of infection such as increased redness, wound formation, or drainage.  Type 2  diabetes mellitus Type 2 diabetes confirmed. Blood glucose well-controlled with insulin . Mounjaro  preferred for glycemic control and weight loss. - Decrease insulin  dose to 30 units once daily. - Prescribe Mounjaro  2.5 mg once weekly, pending insurance prior authorization. - Schedule follow-up appointment in one month to assess response to Mounjaro  and adjust insulin  as needed. - Cancel next week's appointment.  Obesity Obesity management integrated with diabetes treatment. Mounjaro  chosen for weight loss benefits. - Initiate Mounjaro  2.5 mg once weekly to assist with weight loss.  - tirzepatide  (MOUNJARO ) 2.5 MG/0.5ML Pen; Inject 2.5 mg into the skin once a week.  Dispense: 2 mL; Refill: 1   Follow Up Instructions: 1 month in person    I discussed the assessment and treatment plan with the patient. The patient was provided an opportunity to ask questions and all were answered. The patient agreed with the plan and demonstrated an understanding of the instructions.   The patient was advised to call back or seek an in-person evaluation if the symptoms worsen or if the condition fails to improve as anticipated.  I provided 18 minutes of non-face-to-face time during this encounter.   Sharyle Fischer, DO

## 2023-09-19 NOTE — Telephone Encounter (Signed)
 Pharmacy Patient Advocate Encounter   Received notification from CoverMyMeds that prior authorization for Mounjaro  2.5MG /0.5ML auto-injectors  is required/requested.   Insurance verification completed.   The patient is insured through [facility] .   Per test claim: PA required; PA submitted to above mentioned insurance via Latent Key/confirmation #/EOC [id number] Status is pending

## 2023-09-20 ENCOUNTER — Telehealth: Payer: Self-pay | Admitting: Pharmacy Technician

## 2023-09-20 ENCOUNTER — Other Ambulatory Visit: Payer: Self-pay | Admitting: Internal Medicine

## 2023-09-20 ENCOUNTER — Encounter: Payer: Self-pay | Admitting: Internal Medicine

## 2023-09-20 ENCOUNTER — Other Ambulatory Visit (HOSPITAL_COMMUNITY): Payer: Self-pay

## 2023-09-20 DIAGNOSIS — E66813 Obesity, class 3: Secondary | ICD-10-CM

## 2023-09-20 DIAGNOSIS — E1165 Type 2 diabetes mellitus with hyperglycemia: Secondary | ICD-10-CM

## 2023-09-20 MED ORDER — SEMAGLUTIDE(0.25 OR 0.5MG/DOS) 2 MG/3ML ~~LOC~~ SOPN: 0.2500 mg | PEN_INJECTOR | SUBCUTANEOUS | 0 refills | Status: DC

## 2023-09-20 MED ORDER — SEMAGLUTIDE(0.25 OR 0.5MG/DOS) 2 MG/3ML ~~LOC~~ SOPN: 0.2500 mg | PEN_INJECTOR | SUBCUTANEOUS | 1 refills | Status: DC

## 2023-09-20 NOTE — Telephone Encounter (Signed)
 Pharmacy Patient Advocate Encounter  Received notification from [facility] that Prior Authorization for Mounjaro  2.5MG /0.5ML auto-injectors  has been DENIED.  No reason given; No denial letter received via Fax or CMM. It has been requested and will be uploaded to the media tab once received.   PA #/Case ID/Reference #: [id number]

## 2023-09-20 NOTE — Telephone Encounter (Signed)
 Pharmacy Patient Advocate Encounter   Received notification from CoverMyMeds that prior authorization for Ozempic  (0.25 or 0.5 MG/DOSE) 2MG /3ML pen-injectors  is required/requested.   Insurance verification completed.   The patient is insured through [facility] .   Per test claim: PA required; PA submitted to above mentioned insurance via Latent Key/confirmation #/EOC [id number] Status is pending

## 2023-09-23 ENCOUNTER — Other Ambulatory Visit (HOSPITAL_COMMUNITY): Payer: Self-pay

## 2023-09-23 NOTE — Telephone Encounter (Signed)
 Pharmacy Patient Advocate Encounter  Received notification from [facility] that Prior Authorization for Ozempic  (0.25 or 0.5 MG/DOSE) 2MG /3ML pen-injectors  has been DENIED.  No reason given; No denial letter received via Fax or CMM. It has been requested and will be uploaded to the media tab once received.   PA #/Case ID/Reference #: [id number]

## 2023-09-23 NOTE — Telephone Encounter (Signed)
 Can you try her on metformin, I'm pretty sure any GLP PA will continue to get a denial until the plan see that she has tried metformin?

## 2023-09-24 ENCOUNTER — Ambulatory Visit: Admitting: Internal Medicine

## 2023-09-26 ENCOUNTER — Ambulatory Visit: Payer: MEDICAID | Admitting: Podiatry

## 2023-09-27 ENCOUNTER — Encounter: Admitting: Internal Medicine

## 2023-09-27 ENCOUNTER — Encounter: Payer: Self-pay | Admitting: Internal Medicine

## 2023-09-27 NOTE — Progress Notes (Deleted)
 Virtual Visit via Video Note  I connected with Shannon Santos on 09/27/23 at  2:00 PM EDT by a video enabled telemedicine application and verified that I am speaking with the correct person using two identifiers.  Location: Patient: Home Provider: [facility]   I discussed the limitations of evaluation and management by telemedicine and the availability of in person appointments. The patient expressed understanding and agreed to proceed.  History of Present Illness:  Patient is presenting via telemedicine to discus foot and diabetes.   Discussed the use of AI scribe software for clinical note transcription with the patient, who gave verbal consent to proceed.  History of Present Illness Shannon Santos is a 23 year old female with type 2 diabetes who presents with an ingrown toenail and diabetes management.  She experiences pain and redness in her toe due to an ingrown toenail. The nail is curving and causing discomfort, especially when walking or putting weight on it.  She is on insulin  therapy, taking 35 units daily, with an as-needed prescription for Humalog , which she has not used. Blood sugar levels are stable, ranging from 90s to 134 mg/dL, with occasional readings in the 100s. She uses a TrueMetrix glucose meter and manages her diabetes with supplies from [facility]. There are issues with insurance coverage for her diabetes supplies.    Observations/Objective:  General: well appearing, no acute distress ENT: conjunctiva normal appearing bilaterally  Cardio: RRR, no murmurs, rubs or gallops auscultated Skin: no rashes, cyanosis or abnormal bruising noted. Right great toenail thick and curving/ingrown.    Assessment and Plan: Assessment & Plan Ingrown toenail without infection Pain and redness present without infection. - Refer to podiatry for evaluation and potential partial nail removal. - Advise to report signs of infection such as increased redness, wound formation, or drainage.  Type 2  diabetes mellitus Type 2 diabetes confirmed. Blood glucose well-controlled with insulin . Mounjaro  preferred for glycemic control and weight loss. - Decrease insulin  dose to 30 units once daily. - Prescribe Mounjaro  2.5 mg once weekly, pending insurance prior authorization. - Schedule follow-up appointment in one month to assess response to Mounjaro  and adjust insulin  as needed. - Cancel next week's appointment.  Obesity Obesity management integrated with diabetes treatment. Mounjaro  chosen for weight loss benefits. - Initiate Mounjaro  2.5 mg once weekly to assist with weight loss.  - tirzepatide  (MOUNJARO ) 2.5 MG/0.5ML Pen; Inject 2.5 mg into the skin once a week.  Dispense: 2 mL; Refill: 1   Follow Up Instructions: 1 month in person    I discussed the assessment and treatment plan with the patient. The patient was provided an opportunity to ask questions and all were answered. The patient agreed with the plan and demonstrated an understanding of the instructions.   The patient was advised to call back or seek an in-person evaluation if the symptoms worsen or if the condition fails to improve as anticipated.  I provided 18 minutes of non-face-to-face time during this encounter.   Sharyle Fischer, DO

## 2023-09-27 NOTE — Progress Notes (Signed)
 This encounter was created in error - please disregard.

## 2023-10-09 ENCOUNTER — Encounter: Payer: Self-pay | Admitting: Internal Medicine

## 2023-10-09 DIAGNOSIS — E1165 Type 2 diabetes mellitus with hyperglycemia: Secondary | ICD-10-CM

## 2023-10-10 ENCOUNTER — Ambulatory Visit: Payer: MEDICAID | Admitting: Podiatry

## 2023-10-10 MED ORDER — BASAGLAR KWIKPEN 100 UNIT/ML ~~LOC~~ SOPN: 40.0000 [IU] | PEN_INJECTOR | Freq: Every day | SUBCUTANEOUS | 11 refills | Status: DC

## 2023-10-11 ENCOUNTER — Ambulatory Visit: Payer: Self-pay | Admitting: Family Medicine

## 2023-10-11 MED ORDER — BLOOD GLUCOSE TEST VI STRP: 1.0000 | ORAL_STRIP | Freq: Three times a day (TID) | 0 refills | Status: DC

## 2023-10-11 NOTE — Addendum Note (Signed)
 Addended by: YVONE WARREN BROCKS on: 10/11/2023 08:27 AM   Modules accepted: Orders

## 2023-10-22 ENCOUNTER — Ambulatory Visit: Payer: MEDICAID | Admitting: Podiatry

## 2023-10-22 DIAGNOSIS — B351 Tinea unguium: Secondary | ICD-10-CM

## 2023-10-22 DIAGNOSIS — M216X1 Other acquired deformities of right foot: Secondary | ICD-10-CM | POA: Diagnosis not present

## 2023-10-22 DIAGNOSIS — M79675 Pain in left toe(s): Secondary | ICD-10-CM | POA: Diagnosis not present

## 2023-10-22 DIAGNOSIS — M79674 Pain in right toe(s): Secondary | ICD-10-CM

## 2023-10-22 DIAGNOSIS — M216X2 Other acquired deformities of left foot: Secondary | ICD-10-CM

## 2023-10-22 NOTE — Progress Notes (Signed)
  Subjective:  Patient ID: Shannon Santos, female    DOB: 2000-03-01,  MRN: [id number]  Chief Complaint  Patient presents with   Nail Problem    Bilateral hallux nail pt stated that they are curving in and causing pain    23 y.o. female returns for the above complaint.  Patient presents with thickened onychodystrophy mycotic toenails x 10 mild pain on palpation worse with ambulation worse with pressure she would like to have a debridement if she is not able to do her self.  She also would like to discuss orthotics option she states that she has some arch and heel pain when she is ambulating and on her feet denies any other acute issues  Objective:  There were no vitals filed for this visit. Podiatric Exam: Vascular: dorsalis pedis and posterior tibial pulses are palpable bilateral. Capillary return is immediate. Temperature gradient is WNL. Skin turgor WNL  Sensorium: Normal Semmes Weinstein monofilament test. Normal tactile sensation bilaterally. Nail Exam: Pt has thick disfigured discolored nails with subungual debris noted bilateral entire nail hallux through fifth toenails.  Pain on palpation to the nails. Ulcer Exam: There is no evidence of ulcer or pre-ulcerative changes or infection. Orthopedic Exam: Muscle tone and strength are WNL. No limitations in general ROM. No crepitus or effusions noted.  Skin: No Porokeratosis. No infection or ulcers    Assessment & Plan:   1. Other acquired deformities of left foot   2. Other acquired deformities of right foot   3. Pain due to onychomycosis of toenails of both feet     Patient was evaluated and treated and all questions answered.  Pes planovalgus -I explained to patient the etiology of pes planovalgus and relationship with Planter fasciitis and various treatment options were discussed.  Given patient foot structure in the setting of Planter fasciitis I believe patient will benefit from custom-made orthotics to help control the hindfoot  motion support the arch of the foot and take the stress away from plantar fascial.  Patient agrees with the plan like to proceed with orthotics -Patient was casted for orthotics   Onychomycosis with pain  -Nails palliatively debrided as below. -Educated on self-care  Procedure: Nail Debridement Rationale: pain  Type of Debridement: manual, sharp debridement. Instrumentation: Nail nipper, rotary burr. Number of Nails: 10  Procedures and Treatment: Consent by patient was obtained for treatment procedures. The patient understood the discussion of treatment and procedures well. All questions were answered thoroughly reviewed. Debridement of mycotic and hypertrophic toenails, 1 through 5 bilateral and clearing of subungual debris. No ulceration, no infection noted.  Return Visit-Office Procedure: Patient instructed to return to the office for a follow up visit 3 months for continued evaluation and treatment.  Franky Blanch, DPM    No follow-ups on file.

## 2023-10-25 ENCOUNTER — Telehealth: Payer: Self-pay | Admitting: Pharmacy Technician

## 2023-10-25 ENCOUNTER — Other Ambulatory Visit (HOSPITAL_COMMUNITY): Payer: Self-pay

## 2023-10-25 ENCOUNTER — Encounter: Payer: Self-pay | Admitting: Internal Medicine

## 2023-10-25 ENCOUNTER — Ambulatory Visit: Admitting: Internal Medicine

## 2023-10-25 VITALS — BP 128/76 | HR 71 | Resp 16 | Ht 64.0 in | Wt 237.0 lb

## 2023-10-25 DIAGNOSIS — E1165 Type 2 diabetes mellitus with hyperglycemia: Secondary | ICD-10-CM

## 2023-10-25 DIAGNOSIS — Z7985 Long-term (current) use of injectable non-insulin antidiabetic drugs: Secondary | ICD-10-CM

## 2023-10-25 MED ORDER — INSULIN PEN NEEDLE 32G X 6 MM MISC: 99 refills | Status: AC

## 2023-10-25 MED ORDER — TRULICITY 0.75 MG/0.5ML ~~LOC~~ SOAJ: 0.7500 mg | SUBCUTANEOUS | 1 refills | Status: DC

## 2023-10-25 MED ORDER — BLOOD GLUCOSE TEST VI STRP: 1.0000 | ORAL_STRIP | Freq: Three times a day (TID) | 99 refills | Status: AC

## 2023-10-25 NOTE — Progress Notes (Signed)
 Established Patient Office Visit  Subjective    Patient ID: Shannon Santos, female    DOB: 2000-05-11  Age: 23 y.o. MRN: [id number]  CC:  Chief Complaint  Patient presents with   Medication Follow-Up    HPI Shannon Santos presents to follow up on diabetes.  Discussed the use of AI scribe software for clinical note transcription with the patient, who gave verbal consent to proceed.  History of Present Illness  Shannon Santos is a 23 year old female with type 2 diabetes who presents for medication management and follow-up.  She manages her type 2 diabetes with 40 units of Basaglar  insulin  and has stopped using Humalog . Her blood sugar readings range from 85 to 152 mg/dL. Her A1c was 12% at diagnosis.  She faces insurance coverage issues for diabetes medications. Mounjaro  and Ozempic  were not approved, and she is considering Trulicity  or Rybelsus  pending approval. She is exploring options to reduce insulin  dependency.  She monitors blood sugar with test strips, but insurance does not cover them. She is open to using a [employer] continuous glucose monitor again.  She has made dietary changes, resulting in slight weight loss and a one-point decrease in BMI. She seeks to control cravings and is considering medication options for assistance.  She inquires about vitamin supplementation in the context of diabetes, though no specific requirement is noted.   Diabetes -Last A1c 7/25 12.0% - newly diagnosed while inpatient -Medications: Basaglar  decreased to 30 units, no longer on meal time insulin  -Patient is compliant with the above medications and reports no side effects.  -Checking BG at home: 109-120 fasting -Diet: working hard on decreasing carbs and eating healthy -Eye exam: Due, has an optometry appointment next week -Foot exam: Due, discuss at follow up -Microalbumin: UTD -Statin: No -PNA vaccine: Discuss at follow up   Outpatient Encounter Medications as of 10/25/2023   Medication Sig   Blood Glucose Monitoring Suppl (BLOOD GLUCOSE MONITOR SYSTEM) w/Device KIT 1 each by Does not apply route in the morning, at noon, and at bedtime. May substitute to any manufacturer covered by patient's insurance.   Glucose Blood (BLOOD GLUCOSE TEST STRIPS) STRP 1 each by In Vitro route in the morning, at noon, and at bedtime. May substitute to any manufacturer covered by patient's insurance.   Insulin  Glargine (BASAGLAR  KWIKPEN) 100 UNIT/ML Inject 40 Units into the skin daily.   insulin  lispro (HUMALOG ) 100 UNIT/ML KwikPen Inject 10 Units into the skin 3 (three) times daily with meals.   Insulin  Pen Needle 32G X 6 MM MISC as directed.   Semaglutide ,0.25 or 0.5MG /DOS, 2 MG/3ML SOPN Inject 0.25 mg into the skin once a week.   No facility-administered encounter medications on file as of 10/25/2023.    Past Medical History:  Diagnosis Date   Diabetes mellitus without complication (HCC)    Morbid obesity (HCC)     No past surgical history on file.  Family History  Problem Relation Age of Onset   Diabetes Maternal Grandmother     Social History   Socioeconomic History   Marital status: Single    Spouse name: Not on file   Number of children: Not on file   Years of education: Not on file   Highest education level: Bachelor's degree (e.g., BA, AB, BS)  Occupational History   Not on file  Tobacco Use   Smoking status: Some Days    Types: Cigarettes, Cigars   Smokeless tobacco: Never  Vaping Use   Vaping status: Never  Used  Substance and Sexual Activity   Alcohol use: Never   Drug use: Never   Sexual activity: Yes    Partners: Male  Other Topics Concern   Not on file  Social History Narrative   Not on file   Social Drivers of Health   Financial Resource Strain: Low Risk  (09/09/2023)   Overall Financial Resource Strain (CARDIA)    Difficulty of Paying Living Expenses: Not very hard  Food Insecurity: No Food Insecurity (09/09/2023)   Hunger Vital Sign     Worried About Running Out of Food in the Last Year: Never true    Ran Out of Food in the Last Year: Never true  Transportation Needs: No Transportation Needs (09/09/2023)   PRAPARE - Administrator, Civil Service (Medical): No    Lack of Transportation (Non-Medical): No  Physical Activity: Sufficiently Active (09/09/2023)   Exercise Vital Sign    Days of Exercise per Week: 3 days    Minutes of Exercise per Session: 70 min  Stress: No Stress Concern Present (09/09/2023)   [employer] of Occupational Health - Occupational Stress Questionnaire    Feeling of Stress: Not at all  Social Connections: Moderately Integrated (09/09/2023)   Social Connection and Isolation Panel    Frequency of Communication with Friends and Family: More than three times a week    Frequency of Social Gatherings with Friends and Family: Twice a week    Attends Religious Services: 1 to 4 times per year    Active Member of [employer] or Organizations: No    Attends Banker Meetings: Not on file    Marital Status: Living with partner  Intimate Partner Violence: Not At Risk (08/24/2023)   Humiliation, Afraid, Rape, and Kick questionnaire    Fear of Current or Ex-Partner: No    Emotionally Abused: No    Physically Abused: No    Sexually Abused: No    Review of Systems  All other systems reviewed and are negative.       Objective    BP 128/76   Pulse 71   Resp 16   Ht 5' 4 (1.626 m)   Wt 237 lb (107.5 kg)   SpO2 98%   BMI 40.68 kg/m   Physical Exam Constitutional:      Appearance: Normal appearance.  HENT:     Head: Normocephalic and atraumatic.  Eyes:     Conjunctiva/sclera: Conjunctivae normal.  Cardiovascular:     Rate and Rhythm: Normal rate and regular rhythm.  Pulmonary:     Effort: Pulmonary effort is normal.     Breath sounds: Normal breath sounds.  Skin:    General: Skin is warm and dry.  Neurological:     General: No focal deficit present.     Mental Status:  She is alert. Mental status is at baseline.  Psychiatric:        Mood and Affect: Mood normal.        Behavior: Behavior normal.         Assessment & Plan:   Assessment & Plan Type 2 diabetes mellitus Diabetes well-controlled with Basaglar  insulin . A1c improved from 12% to ~7%. Discussed potential insulin  reduction with GLP-1 receptor agonist. Insurance coverage issues for Mounjaro  and Ozempic . Trulicity  preferred for weight loss; Rybelsus  for A1c control. Open to Trulicity  trial. Insurance does not cover test strips. Interested in continuous glucose monitor. - Send prescription for low dose Trulicity  ([facility] mg[facility]. - Refill  test strips prescription. - Provide sample of [employer] continuous glucose monitor for a two-week trial. - If Trulicity  is started, reduce insulin  to 25 units and monitor blood sugar levels. - Plan follow-up appointment in one month for A1c recheck and Trulicity  evaluation.  Obesity BMI decreased by one point. Discussed challenges of weight loss on insulin . Emphasized lifestyle modifications. Consider Trulicity  for weight loss pending insurance approval. - Discuss lifestyle modifications for weight management, including consistent dietary habits. - Consider Trulicity  for weight loss benefits, pending insurance approval.  General Health Maintenance Advised on vitamins. Cautioned against unproven supplements for blood sugar control. - Recommend a once daily women's multivitamin if dietary intake is lacking. - Advise caution with supplements that claim to improve blood sugar control, especially those with added sugars.  - Dulaglutide  (TRULICITY ) 0.75 MG/0.5ML SOAJ; Inject 0.75 mg into the skin once a week.  Dispense: 2 mL; Refill: 1 - Glucose Blood (BLOOD GLUCOSE TEST STRIPS) STRP; 1 each by In Vitro route in the morning, at noon, and at bedtime. May substitute to any manufacturer covered by patient's insurance.  Dispense: 100 each; Refill: PRN -  Insulin  Pen Needle 32G X 6 MM MISC; To use with Basaglar .  Dispense: 100 each; Refill: PRN   Return in about 4 weeks (around 11/22/2023) for after 10/26 for A1c, follow up on a1c.   Sharyle Fischer, DO

## 2023-10-25 NOTE — Telephone Encounter (Signed)
 Pharmacy Patient Advocate Encounter   Received notification from Onbase that prior authorization for Trulicity  0.75MG /0.5ML auto-injectors  is required/requested.   Insurance verification completed.   The patient is insured through [facility] MEDICAID .   Per test claim: PA required; PA submitted to above mentioned insurance via Latent Key/confirmation #/EOC [id number] Status is pending

## 2023-10-26 ENCOUNTER — Other Ambulatory Visit: Payer: Self-pay | Admitting: Internal Medicine

## 2023-10-26 DIAGNOSIS — E1165 Type 2 diabetes mellitus with hyperglycemia: Secondary | ICD-10-CM

## 2023-10-28 NOTE — Telephone Encounter (Signed)
 Pharmacy Patient Advocate Encounter  Received notification from [facility] MEDICAID that Prior Authorization for Trulicity  0.75MG /0.5ML auto-injectors  has been DENIED.  No reason given; No denial letter received via Fax or CMM. It has been requested and will be uploaded to the media tab once received.   PA #/Case ID/Reference #: [id number]

## 2023-10-29 ENCOUNTER — Ambulatory Visit: Admitting: Dietician

## 2023-10-29 NOTE — Telephone Encounter (Signed)
 Requested medication (s) are due for refill today: yes  Requested medication (s) are on the active medication list: yes  Last refill:  10/25/23  Future visit scheduled: yes  Notes to clinic:  Pharmacy comment: Alternative Requested:PRIOR AUTH DENIED.      Requested Prescriptions  Pending Prescriptions Disp Refills   TRULICITY  0.75 MG/0.5ML SOAJ [Pharmacy Med Name: TRULICITY  0.75 MG/0.5 ML PEN]  1    Sig: INJECT 0.75 MG SUBCUTANEOUSLY ONE TIME PER WEEK     Endocrinology:  Diabetes - GLP-1 Receptor Agonists Failed - 10/29/2023 11:46 AM      Failed - HBA1C is between 0 and 7.9 and within 180 days    Hgb A1c MFr Bld  Date Value Ref Range Status  08/24/2023 12.0 (H) 4.8 - 5.6 % Final    Comment:    (NOTE) Diagnosis of Diabetes The following HbA1c ranges recommended by the American Diabetes Association (ADA) may be used as an aid in the diagnosis of diabetes mellitus.  Hemoglobin             Suggested A1C NGSP%              Diagnosis  <5.7                   Non Diabetic  5.7-6.4                Pre-Diabetic  >6.4                   Diabetic  <7.0                   Glycemic control for                       adults with diabetes.           Passed - Valid encounter within last 6 months    Recent Outpatient Visits           4 days ago Type 2 diabetes mellitus with hyperglycemia, without long-term current use of insulin  [facility])   [place] [facility] Bernardo Fend, [place]   1 month ago Type 2 diabetes mellitus with hyperglycemia, without long-term current use of insulin  [facility])   [place] [facility] Bernardo Fend, [place]   1 month ago Type 2 diabetes mellitus with hyperglycemia, without long-term current use of insulin  [facility])   [facility] Bernardo Fend, [place]

## 2023-10-30 IMAGING — DX DG NECK SOFT TISSUE
2 series · 2 of 2 positions shown · non-contrast
Comparison: Chest radiographs 11/20/2020.

CLINICAL DATA: Provided history: Possible foreign body in throat.
Additional history provided: Patient feels as though something is
stuck in throat.

EXAM:
NECK SOFT TISSUES - 1+ VIEW

[cervical spine ap]
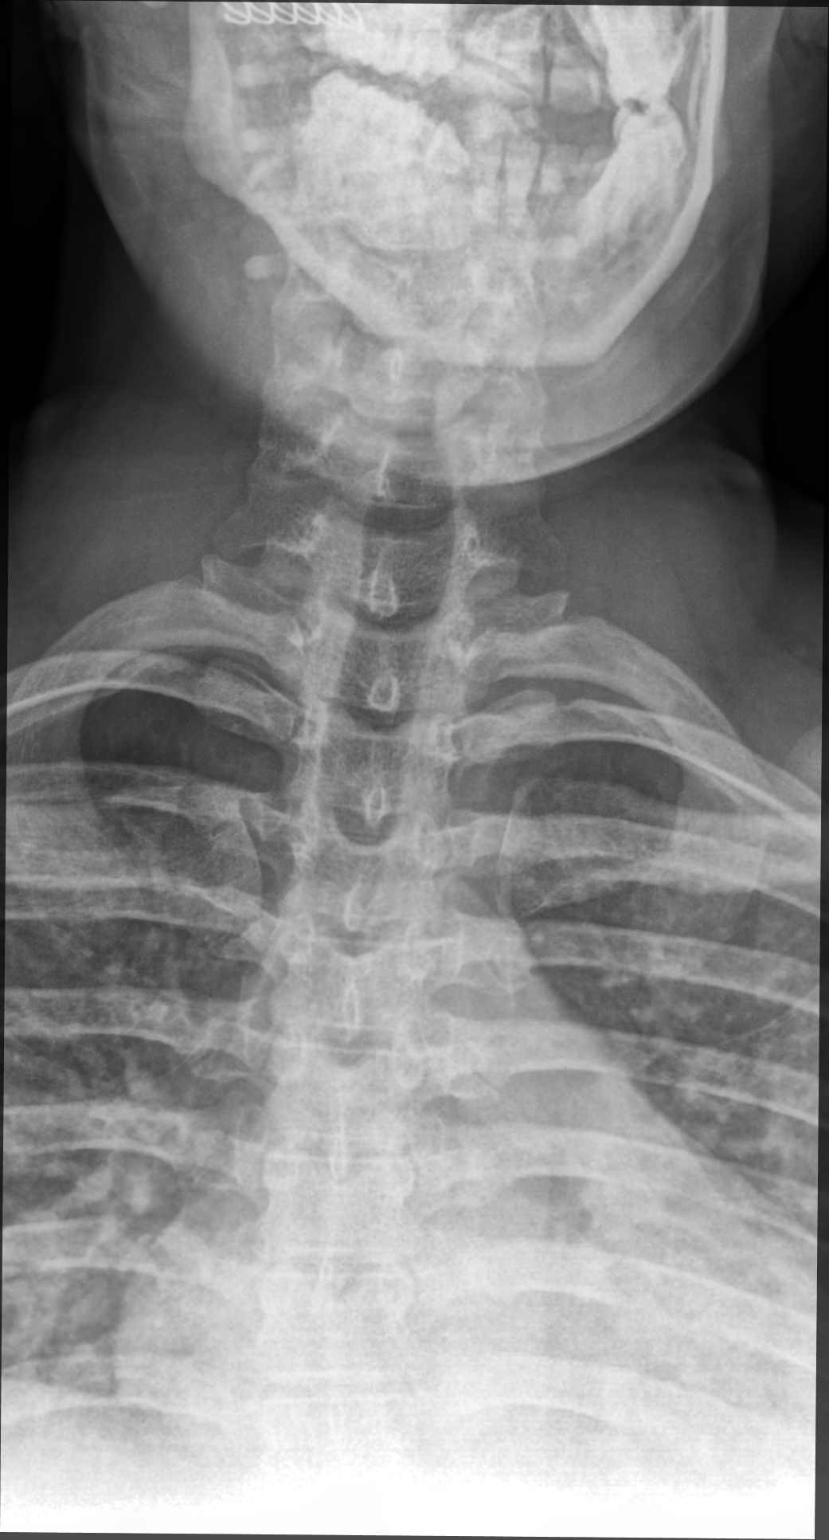

[cervical spine lat]
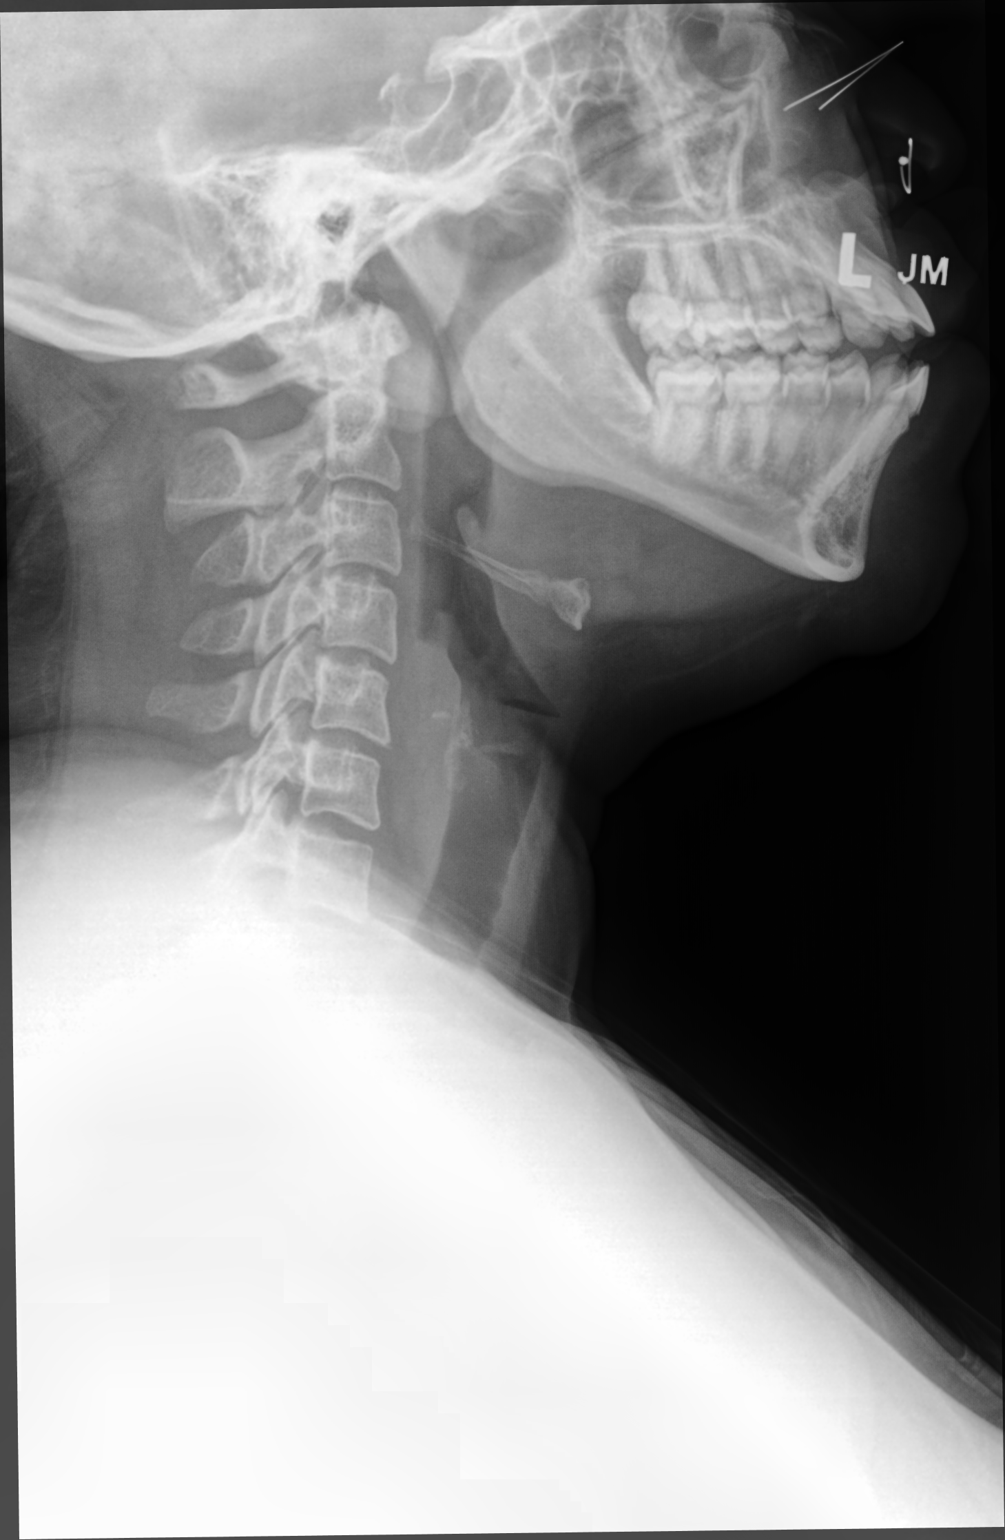

[2 of 2 positions shown; findings below may reference images not displayed]

FINDINGS: There is no evidence of retropharyngeal soft tissue swelling or
epiglottic enlargement. The cervical airway is unremarkable and no
radio-opaque foreign body identified.
IMPRESSION: Unremarkable examination.  No radiopaque foreign body identified.

## 2023-11-04 ENCOUNTER — Other Ambulatory Visit (HOSPITAL_COMMUNITY): Payer: Self-pay

## 2023-11-04 NOTE — Telephone Encounter (Signed)
 Good morning, please see denial letter for trulicity  in patient's media tab dated 10/28/2023

## 2023-11-05 ENCOUNTER — Other Ambulatory Visit: Payer: Self-pay | Admitting: Internal Medicine

## 2023-11-06 NOTE — Telephone Encounter (Signed)
 Called patient and let her know the denial was due to her not trying Metformin 1st.

## 2023-11-27 ENCOUNTER — Ambulatory Visit: Admitting: Internal Medicine

## 2023-12-02 ENCOUNTER — Telehealth: Payer: Self-pay

## 2023-12-02 NOTE — Telephone Encounter (Signed)
 Orthotics are here Balance $0 Appt needed for fitting/pu  Orthotics in [place] bin

## 2023-12-06 ENCOUNTER — Other Ambulatory Visit: Payer: Self-pay

## 2023-12-06 ENCOUNTER — Encounter: Payer: Self-pay | Admitting: Internal Medicine

## 2023-12-06 ENCOUNTER — Ambulatory Visit: Admitting: Internal Medicine

## 2023-12-06 VITALS — BP 118/72 | HR 86 | Temp 97.9°F | Resp 16 | Ht 64.0 in | Wt 230.8 lb

## 2023-12-06 DIAGNOSIS — Z23 Encounter for immunization: Secondary | ICD-10-CM

## 2023-12-06 DIAGNOSIS — E1165 Type 2 diabetes mellitus with hyperglycemia: Secondary | ICD-10-CM | POA: Diagnosis not present

## 2023-12-06 DIAGNOSIS — Z794 Long term (current) use of insulin: Secondary | ICD-10-CM | POA: Diagnosis not present

## 2023-12-06 LAB — POCT GLYCOSYLATED HEMOGLOBIN (HGB A1C): Hemoglobin A1C: 6.1 % — AB (ref 4.0–5.6)

## 2023-12-06 MED ORDER — METFORMIN HCL 500 MG PO TABS: 500.0000 mg | ORAL_TABLET | Freq: Two times a day (BID) | ORAL | 1 refills | Status: AC

## 2023-12-06 MED ORDER — ACCU-CHEK SOFTCLIX LANCETS MISC: 12 refills | Status: AC

## 2023-12-06 MED ORDER — BASAGLAR KWIKPEN 100 UNIT/ML ~~LOC~~ SOPN: 25.0000 [IU] | PEN_INJECTOR | Freq: Every day | SUBCUTANEOUS | 5 refills | Status: AC

## 2023-12-06 NOTE — Progress Notes (Signed)
 Established Patient Office Visit  Subjective    Patient ID: Shannon Santos, female    DOB: 27-Jun-2000  Age: 23 y.o. MRN: [id number]  CC:  Chief Complaint  Patient presents with   Diabetes    HPI Shannon Santos presents to follow up on diabetes.  Discussed the use of AI scribe software for clinical note transcription with the patient, who gave verbal consent to proceed.  History of Present Illness  Shannon Santos is a 23 year old female with diabetes who presents for follow-up on her diabetes management.  Her A1c has improved to 6.1 from a previous level of 12. She is on an insulin  regimen with Basaglar  at 30 units and has not experienced any side effects. She has not started Trulicity  due to insurance requirements to try metformin first. She is considering changing from Medicaid to [employer] in January, which may affect her medication coverage. She has received her flu shot and is due for a tetanus vaccine.  Diabetes -Last A1c 7/25 12.0% - newly diagnosed while inpatient -Medications: Basaglar  decreased to 30 units, no longer on meal time insulin  -Insurance not covering GLP-1's  -Patient is compliant with the above medications and reports no side effects.  -Checking BG at home: 109-120 fasting -Diet: working hard on decreasing carbs and eating healthy -Eye exam: UTD -Foot exam: Due -Microalbumin: UTD -Statin: No -PNA vaccine: Discuss at follow up  Health Maintenance: -Blood work UTD -Flu and Tdap given today  Outpatient Encounter Medications as of 12/06/2023  Medication Sig   Dulaglutide  (TRULICITY ) 0.75 MG/0.5ML SOAJ Inject 0.75 mg into the skin once a week.   Insulin  Glargine (BASAGLAR  KWIKPEN) 100 UNIT/ML Inject 40 Units into the skin daily.   Blood Glucose Monitoring Suppl (BLOOD GLUCOSE MONITOR SYSTEM) w/Device KIT 1 each by Does not apply route in the morning, at noon, and at bedtime. May substitute to any manufacturer covered by patient's insurance.   Glucose  Blood (BLOOD GLUCOSE TEST STRIPS) STRP 1 each by In Vitro route in the morning, at noon, and at bedtime. May substitute to any manufacturer covered by patient's insurance.   Insulin  Pen Needle 32G X 6 MM MISC To use with Basaglar .   No facility-administered encounter medications on file as of 12/06/2023.    Past Medical History:  Diagnosis Date   Diabetes mellitus without complication (HCC)    Morbid obesity (HCC)     No past surgical history on file.  Family History  Problem Relation Age of Onset   Diabetes Maternal Grandmother     Social History   Socioeconomic History   Marital status: Single    Spouse name: Not on file   Number of children: Not on file   Years of education: Not on file   Highest education level: Bachelor's degree (e.g., BA, AB, BS)  Occupational History   Not on file  Tobacco Use   Smoking status: Some Days    Types: Cigarettes, Cigars   Smokeless tobacco: Never  Vaping Use   Vaping status: Never Used  Substance and Sexual Activity   Alcohol use: Never   Drug use: Never   Sexual activity: Yes    Partners: Male  Other Topics Concern   Not on file  Social History Narrative   Not on file   Social Drivers of Health   Financial Resource Strain: Low Risk  (09/09/2023)   Overall Financial Resource Strain (CARDIA)    Difficulty of Paying Living Expenses: Not very hard  Food Insecurity: No  Food Insecurity (09/09/2023)   Hunger Vital Sign    Worried About Running Out of Food in the Last Year: Never true    Ran Out of Food in the Last Year: Never true  Transportation Needs: No Transportation Needs (09/09/2023)   PRAPARE - Administrator, Civil Service (Medical): No    Lack of Transportation (Non-Medical): No  Physical Activity: Sufficiently Active (09/09/2023)   Exercise Vital Sign    Days of Exercise per Week: 3 days    Minutes of Exercise per Session: 70 min  Stress: No Stress Concern Present (09/09/2023)   [employer] of  Occupational Health - Occupational Stress Questionnaire    Feeling of Stress: Not at all  Social Connections: Moderately Integrated (09/09/2023)   Social Connection and Isolation Panel    Frequency of Communication with Friends and Family: More than three times a week    Frequency of Social Gatherings with Friends and Family: Twice a week    Attends Religious Services: 1 to 4 times per year    Active Member of [employer] or Organizations: No    Attends Banker Meetings: Not on file    Marital Status: Living with partner  Intimate Partner Violence: Not At Risk (08/24/2023)   Humiliation, Afraid, Rape, and Kick questionnaire    Fear of Current or Ex-Partner: No    Emotionally Abused: No    Physically Abused: No    Sexually Abused: No    Review of Systems  All other systems reviewed and are negative.       Objective    BP 118/72 (Cuff Size: Large)   Pulse 86   Temp 97.9 F (36.6 C) (Oral)   Resp 16   Ht 5' 4 (1.626 m)   Wt 230 lb 12.8 oz (104.7 kg)   SpO2 100%   BMI 39.62 kg/m   Physical Exam Constitutional:      Appearance: Normal appearance.  HENT:     Head: Normocephalic and atraumatic.  Eyes:     Conjunctiva/sclera: Conjunctivae normal.  Cardiovascular:     Rate and Rhythm: Normal rate and regular rhythm.     Pulses:          Dorsalis pedis pulses are 2+ on the right side and 2+ on the left side.  Pulmonary:     Effort: Pulmonary effort is normal.     Breath sounds: Normal breath sounds.  Musculoskeletal:     Right foot: Normal range of motion. No deformity, bunion, Charcot foot, foot drop or prominent metatarsal heads.     Left foot: Normal range of motion. No deformity, bunion, Charcot foot, foot drop or prominent metatarsal heads.  Feet:     Right foot:     Protective Sensation: 6 sites tested.  6 sites sensed.     Skin integrity: Skin integrity normal.     Toenail Condition: Right toenails are normal.     Left foot:     Protective Sensation: 6  sites tested.  6 sites sensed.     Skin integrity: Skin integrity normal.     Toenail Condition: Left toenails are normal.  Skin:    General: Skin is warm and dry.  Neurological:     General: No focal deficit present.     Mental Status: She is alert. Mental status is at baseline.  Psychiatric:        Mood and Affect: Mood normal.        Behavior: Behavior normal.  Assessment & Plan:   Assessment & Plan  Type 2 diabetes mellitus  Type 2 diabetes mellitus with significant improvement in glycemic control. Recent A1c is 6.1, down from 12. Insurance requires trial of metformin before GLP-1 agonist approval. Metformin less effective than GLP-1 agonists for weight loss and glycemic control. Potential GI side effects of metformin discussed. Plan to reduce insulin  dosage due to improved glycemic control. Future potential to discontinue insulin  if GLP-1 agonist is approved and effective. - Prescribed metformin 500 mg once daily for two weeks, then increase to twice daily if tolerated. - Reduced insulin  dosage to 25 units. - Scheduled follow-up in three months to reassess diabetes management and insurance status. - Performed diabetic foot exam.  Immunization: influenza and tetanus-diphtheria-pertussis Influenza vaccine administered today. TDAP vaccine is due as last administered in 2011. Discussed importance of TDAP vaccine for protection against tetanus, diphtheria, and pertussis. Potential insurance coverage issues discussed, but vaccine is considered standard of care. - Administered TDAP vaccine today. - Discussed potential insurance coverage for TDAP vaccine.  - POCT HgB A1C - HM Diabetes Foot Exam - metFORMIN (GLUCOPHAGE) 500 MG tablet; Take 1 tablet (500 mg total) by mouth 2 (two) times daily with a meal.  Dispense: 180 tablet; Refill: 1 - Insulin  Glargine (BASAGLAR  KWIKPEN) 100 UNIT/ML; Inject 25 Units into the skin daily.  Dispense: 15 mL; Refill: 5 - Accu-Chek Softclix  Lancets lancets; Use as instructed  Dispense: 100 each; Refill: 12 - Flu vaccine trivalent PF, 6mos and older(Flulaval,Afluria,Fluarix,Fluzone) - Tdap vaccine greater than or equal to 7yo IM   Return in about 3 months (around 03/07/2024) for follow up on a1c.   Sharyle Fischer, DO

## 2024-01-09 ENCOUNTER — Ambulatory Visit: Admitting: Internal Medicine

## 2024-01-31 ENCOUNTER — Ambulatory Visit: Payer: Self-pay | Admitting: Internal Medicine

## 2024-02-27 ENCOUNTER — Telehealth: Payer: Self-pay | Admitting: Podiatry

## 2024-02-27 NOTE — Telephone Encounter (Signed)
 Orthotics in [place] called patient 2x LM on VM for pt to call to schedule PUO.
